# Patient Record
Sex: Female | Born: 1955 | Race: White | Hispanic: No | Marital: Married | Smoking: Never smoker
Health system: Southern US, Community
[De-identification: ages and names within clinical notes are randomized; demographics above are authoritative.]

## PROBLEM LIST (undated history)

## (undated) DIAGNOSIS — R112 Nausea with vomiting, unspecified: Secondary | ICD-10-CM

## (undated) DIAGNOSIS — T8859XA Other complications of anesthesia, initial encounter: Secondary | ICD-10-CM

## (undated) DIAGNOSIS — T4145XA Adverse effect of unspecified anesthetic, initial encounter: Secondary | ICD-10-CM

## (undated) DIAGNOSIS — E119 Type 2 diabetes mellitus without complications: Secondary | ICD-10-CM

## (undated) DIAGNOSIS — I213 ST elevation (STEMI) myocardial infarction of unspecified site: Secondary | ICD-10-CM

## (undated) DIAGNOSIS — E785 Hyperlipidemia, unspecified: Secondary | ICD-10-CM

## (undated) DIAGNOSIS — I2510 Atherosclerotic heart disease of native coronary artery without angina pectoris: Secondary | ICD-10-CM

## (undated) DIAGNOSIS — E042 Nontoxic multinodular goiter: Secondary | ICD-10-CM

## (undated) DIAGNOSIS — Z9889 Other specified postprocedural states: Secondary | ICD-10-CM

## (undated) DIAGNOSIS — E1169 Type 2 diabetes mellitus with other specified complication: Secondary | ICD-10-CM

## (undated) DIAGNOSIS — I10 Essential (primary) hypertension: Secondary | ICD-10-CM

## (undated) HISTORY — PX: HERNIA REPAIR: SHX51

## (undated) HISTORY — PX: CHOLECYSTECTOMY: SHX55

## (undated) HISTORY — PX: ABDOMINAL SURGERY: SHX537

## (undated) HISTORY — DX: Nontoxic multinodular goiter: E04.2

## (undated) HISTORY — PX: TONSILLECTOMY: SUR1361

---

## 1997-11-20 ENCOUNTER — Ambulatory Visit (HOSPITAL_COMMUNITY): Admission: RE | Admit: 1997-11-20 | Discharge: 1997-11-20 | Payer: Self-pay | Admitting: Obstetrics and Gynecology

## 2003-03-07 ENCOUNTER — Other Ambulatory Visit: Admission: RE | Admit: 2003-03-07 | Discharge: 2003-03-07 | Payer: Self-pay | Admitting: Obstetrics and Gynecology

## 2005-08-26 ENCOUNTER — Other Ambulatory Visit: Admission: RE | Admit: 2005-08-26 | Discharge: 2005-08-26 | Payer: Self-pay | Admitting: Obstetrics and Gynecology

## 2005-09-08 ENCOUNTER — Encounter: Admission: RE | Admit: 2005-09-08 | Discharge: 2005-09-08 | Payer: Self-pay | Admitting: Obstetrics and Gynecology

## 2006-08-27 ENCOUNTER — Other Ambulatory Visit: Admission: RE | Admit: 2006-08-27 | Discharge: 2006-08-27 | Payer: Self-pay | Admitting: Obstetrics and Gynecology

## 2006-08-27 ENCOUNTER — Encounter: Admission: RE | Admit: 2006-08-27 | Discharge: 2006-08-27 | Payer: Self-pay | Admitting: Obstetrics and Gynecology

## 2010-07-21 ENCOUNTER — Ambulatory Visit: Admit: 2010-07-21 | Payer: Self-pay | Admitting: Obstetrics and Gynecology

## 2015-09-05 ENCOUNTER — Other Ambulatory Visit (HOSPITAL_COMMUNITY): Payer: Self-pay | Admitting: Internal Medicine

## 2015-09-05 DIAGNOSIS — Z1231 Encounter for screening mammogram for malignant neoplasm of breast: Secondary | ICD-10-CM

## 2015-09-11 ENCOUNTER — Ambulatory Visit (HOSPITAL_COMMUNITY): Payer: Self-pay

## 2016-11-10 DIAGNOSIS — I213 ST elevation (STEMI) myocardial infarction of unspecified site: Secondary | ICD-10-CM

## 2016-11-10 HISTORY — DX: ST elevation (STEMI) myocardial infarction of unspecified site: I21.3

## 2016-11-28 ENCOUNTER — Encounter (HOSPITAL_COMMUNITY): Payer: Self-pay

## 2016-11-28 ENCOUNTER — Emergency Department (HOSPITAL_COMMUNITY): Payer: 59

## 2016-11-28 ENCOUNTER — Encounter (HOSPITAL_COMMUNITY)
Admission: EM | Disposition: A | Discharge status: Home / Self Care | Payer: Self-pay | Source: Home / Self Care | Attending: Cardiovascular Disease

## 2016-11-28 ENCOUNTER — Inpatient Hospital Stay (HOSPITAL_COMMUNITY)
Admission: EM | Admit: 2016-11-28 | Discharge: 2016-11-30 | DRG: 247 | Disposition: A | Discharge status: Home / Self Care | Payer: 59 | Attending: Cardiovascular Disease | Admitting: Cardiovascular Disease

## 2016-11-28 DIAGNOSIS — Z7982 Long term (current) use of aspirin: Secondary | ICD-10-CM | POA: Diagnosis not present

## 2016-11-28 DIAGNOSIS — Z79899 Other long term (current) drug therapy: Secondary | ICD-10-CM | POA: Diagnosis not present

## 2016-11-28 DIAGNOSIS — E1169 Type 2 diabetes mellitus with other specified complication: Secondary | ICD-10-CM

## 2016-11-28 DIAGNOSIS — E785 Hyperlipidemia, unspecified: Secondary | ICD-10-CM | POA: Diagnosis present

## 2016-11-28 DIAGNOSIS — I213 ST elevation (STEMI) myocardial infarction of unspecified site: Secondary | ICD-10-CM

## 2016-11-28 DIAGNOSIS — I10 Essential (primary) hypertension: Secondary | ICD-10-CM | POA: Diagnosis present

## 2016-11-28 DIAGNOSIS — E669 Obesity, unspecified: Secondary | ICD-10-CM | POA: Diagnosis present

## 2016-11-28 DIAGNOSIS — I2510 Atherosclerotic heart disease of native coronary artery without angina pectoris: Secondary | ICD-10-CM

## 2016-11-28 DIAGNOSIS — R079 Chest pain, unspecified: Secondary | ICD-10-CM | POA: Diagnosis not present

## 2016-11-28 DIAGNOSIS — I2121 ST elevation (STEMI) myocardial infarction involving left circumflex coronary artery: Secondary | ICD-10-CM | POA: Diagnosis present

## 2016-11-28 DIAGNOSIS — Z6841 Body Mass Index (BMI) 40.0 and over, adult: Secondary | ICD-10-CM | POA: Diagnosis not present

## 2016-11-28 DIAGNOSIS — Z8249 Family history of ischemic heart disease and other diseases of the circulatory system: Secondary | ICD-10-CM

## 2016-11-28 DIAGNOSIS — E041 Nontoxic single thyroid nodule: Secondary | ICD-10-CM | POA: Diagnosis present

## 2016-11-28 DIAGNOSIS — Z7984 Long term (current) use of oral hypoglycemic drugs: Secondary | ICD-10-CM

## 2016-11-28 DIAGNOSIS — Z955 Presence of coronary angioplasty implant and graft: Secondary | ICD-10-CM

## 2016-11-28 DIAGNOSIS — E119 Type 2 diabetes mellitus without complications: Secondary | ICD-10-CM | POA: Diagnosis present

## 2016-11-28 HISTORY — DX: Type 2 diabetes mellitus without complications: E11.9

## 2016-11-28 HISTORY — DX: Atherosclerotic heart disease of native coronary artery without angina pectoris: I25.10

## 2016-11-28 HISTORY — PX: CORONARY STENT INTERVENTION: CATH118234

## 2016-11-28 HISTORY — PX: LEFT HEART CATH AND CORONARY ANGIOGRAPHY: CATH118249

## 2016-11-28 HISTORY — DX: Essential (primary) hypertension: I10

## 2016-11-28 LAB — BASIC METABOLIC PANEL
Anion gap: 9 (ref 5–15)
BUN: 20 mg/dL (ref 6–20)
CHLORIDE: 102 mmol/L (ref 101–111)
CO2: 24 mmol/L (ref 22–32)
CREATININE: 1.23 mg/dL — AB (ref 0.44–1.00)
Calcium: 9.7 mg/dL (ref 8.9–10.3)
GFR calc Af Amer: 55 mL/min — ABNORMAL LOW (ref >60)
GFR calc non Af Amer: 47 mL/min — ABNORMAL LOW (ref >60)
GLUCOSE: 212 mg/dL — AB (ref 65–99)
POTASSIUM: 4.5 mmol/L (ref 3.5–5.1)
SODIUM: 135 mmol/L (ref 135–145)

## 2016-11-28 LAB — LIPID PANEL
CHOL/HDL RATIO: 4.3 ratio
Cholesterol: 205 mg/dL — ABNORMAL HIGH (ref 0–200)
HDL: 48 mg/dL (ref >40)
LDL CALC: 121 mg/dL — AB (ref 0–99)
TRIGLYCERIDES: 181 mg/dL — AB (ref <150)
VLDL: 36 mg/dL (ref 0–40)

## 2016-11-28 LAB — I-STAT TROPONIN, ED: Troponin i, poc: 0.51 ng/mL (ref 0.00–0.08)

## 2016-11-28 LAB — CBC WITH DIFFERENTIAL/PLATELET
Basophils Absolute: 0.1 10*3/uL (ref 0.0–0.1)
Basophils Relative: 1 %
EOS ABS: 0.3 10*3/uL (ref 0.0–0.7)
Eosinophils Relative: 4 %
HCT: 40.8 % (ref 36.0–46.0)
HEMOGLOBIN: 13.5 g/dL (ref 12.0–15.0)
LYMPHS ABS: 2.7 10*3/uL (ref 0.7–4.0)
LYMPHS PCT: 30 %
MCH: 29.2 pg (ref 26.0–34.0)
MCHC: 33.1 g/dL (ref 30.0–36.0)
MCV: 88.1 fL (ref 78.0–100.0)
MONOS PCT: 7 %
Monocytes Absolute: 0.6 10*3/uL (ref 0.1–1.0)
NEUTROS PCT: 58 %
Neutro Abs: 5.3 10*3/uL (ref 1.7–7.7)
Platelets: 311 10*3/uL (ref 150–400)
RBC: 4.63 MIL/uL (ref 3.87–5.11)
RDW: 13.4 % (ref 11.5–15.5)
WBC: 9 10*3/uL (ref 4.0–10.5)

## 2016-11-28 LAB — APTT: aPTT: 33 seconds (ref 24–36)

## 2016-11-28 LAB — HEPATIC FUNCTION PANEL
ALT: 30 U/L (ref 14–54)
AST: 38 U/L (ref 15–41)
Albumin: 4.1 g/dL (ref 3.5–5.0)
Alkaline Phosphatase: 110 U/L (ref 38–126)
BILIRUBIN DIRECT: 0.1 mg/dL (ref 0.1–0.5)
BILIRUBIN INDIRECT: 0.6 mg/dL (ref 0.3–0.9)
TOTAL PROTEIN: 7.6 g/dL (ref 6.5–8.1)
Total Bilirubin: 0.7 mg/dL (ref 0.3–1.2)

## 2016-11-28 LAB — CREATININE, SERUM
Creatinine, Ser: 1.07 mg/dL — ABNORMAL HIGH (ref 0.44–1.00)
GFR calc Af Amer: >60 mL/min (ref >60)
GFR calc non Af Amer: 56 mL/min — ABNORMAL LOW (ref >60)

## 2016-11-28 LAB — BRAIN NATRIURETIC PEPTIDE: B Natriuretic Peptide: 69.6 pg/mL (ref 0.0–100.0)

## 2016-11-28 LAB — CBC
HCT: 39.5 % (ref 36.0–46.0)
Hemoglobin: 12.7 g/dL (ref 12.0–15.0)
MCH: 28.5 pg (ref 26.0–34.0)
MCHC: 32.2 g/dL (ref 30.0–36.0)
MCV: 88.8 fL (ref 78.0–100.0)
PLATELETS: 307 10*3/uL (ref 150–400)
RBC: 4.45 MIL/uL (ref 3.87–5.11)
RDW: 13.6 % (ref 11.5–15.5)
WBC: 10.5 10*3/uL (ref 4.0–10.5)

## 2016-11-28 LAB — MRSA PCR SCREENING: MRSA BY PCR: NEGATIVE

## 2016-11-28 LAB — GLUCOSE, CAPILLARY: GLUCOSE-CAPILLARY: 154 mg/dL — AB (ref 65–99)

## 2016-11-28 LAB — PROTIME-INR
INR: 0.96
Prothrombin Time: 12.8 seconds (ref 11.4–15.2)

## 2016-11-28 LAB — TROPONIN I: TROPONIN I: 2.05 ng/mL — AB (ref <0.03)

## 2016-11-28 SURGERY — LEFT HEART CATH AND CORONARY ANGIOGRAPHY
Anesthesia: LOCAL

## 2016-11-28 MED ORDER — FENTANYL CITRATE (PF) 100 MCG/2ML IJ SOLN
INTRAMUSCULAR | Status: DC | PRN
Start: 2016-11-28 — End: 2016-11-28
  Administered 2016-11-28 (×2): 25 ug via INTRAVENOUS

## 2016-11-28 MED ORDER — VERAPAMIL HCL 2.5 MG/ML IV SOLN
INTRAVENOUS | Status: AC
  Filled 2016-11-28: qty 2

## 2016-11-28 MED ORDER — TICAGRELOR 90 MG PO TABS
90.0000 mg | ORAL_TABLET | Freq: Two times a day (BID) | ORAL | Status: DC
  Administered 2016-11-29 – 2016-11-30 (×3): 90 mg via ORAL
  Filled 2016-11-28 (×3): qty 1

## 2016-11-28 MED ORDER — HEPARIN (PORCINE) IN NACL 2-0.9 UNIT/ML-% IJ SOLN
INTRAMUSCULAR | Status: DC | PRN
Start: 2016-11-28 — End: 2016-11-28
  Administered 2016-11-28: 16:00:00

## 2016-11-28 MED ORDER — SODIUM CHLORIDE 0.9 % IV SOLN: INTRAVENOUS | Status: AC

## 2016-11-28 MED ORDER — OLMESARTAN MEDOXOMIL-HCTZ 40-12.5 MG PO TABS: 1.0000 | ORAL_TABLET | Freq: Every day | ORAL | Status: DC

## 2016-11-28 MED ORDER — TIROFIBAN (AGGRASTAT) BOLUS VIA INFUSION
INTRAVENOUS | Status: DC | PRN
  Administered 2016-11-28: 3005 ug via INTRAVENOUS

## 2016-11-28 MED ORDER — METOPROLOL TARTRATE 25 MG PO TABS
25.0000 mg | ORAL_TABLET | Freq: Two times a day (BID) | ORAL | Status: DC
  Administered 2016-11-29 – 2016-11-30 (×2): 25 mg via ORAL
  Filled 2016-11-28 (×3): qty 1

## 2016-11-28 MED ORDER — TIROFIBAN HCL IN NACL 5-0.9 MG/100ML-% IV SOLN: INTRAVENOUS | Status: DC | PRN

## 2016-11-28 MED ORDER — SODIUM CHLORIDE 0.9% FLUSH: 3.0000 mL | INTRAVENOUS | Status: DC | PRN

## 2016-11-28 MED ORDER — NITROGLYCERIN 1 MG/10 ML FOR IR/CATH LAB
INTRA_ARTERIAL | Status: DC | PRN
  Administered 2016-11-28 (×2): 200 ug via INTRACORONARY

## 2016-11-28 MED ORDER — IOPAMIDOL (ISOVUE-370) INJECTION 76%
INTRAVENOUS | Status: DC | PRN
  Administered 2016-11-28: 160 mL

## 2016-11-28 MED ORDER — HEPARIN SODIUM (PORCINE) 1000 UNIT/ML IJ SOLN
INTRAMUSCULAR | Status: AC
  Filled 2016-11-28: qty 1

## 2016-11-28 MED ORDER — NITROGLYCERIN 1 MG/10 ML FOR IR/CATH LAB
INTRA_ARTERIAL | Status: AC
  Filled 2016-11-28: qty 10

## 2016-11-28 MED ORDER — AMLODIPINE BESYLATE 5 MG PO TABS
5.0000 mg | ORAL_TABLET | Freq: Every day | ORAL | Status: DC
  Administered 2016-11-28 – 2016-11-29 (×2): 5 mg via ORAL
  Filled 2016-11-28 (×3): qty 1

## 2016-11-28 MED ORDER — ATORVASTATIN CALCIUM 80 MG PO TABS
80.0000 mg | ORAL_TABLET | Freq: Every day | ORAL | Status: DC
Start: 2016-11-28 — End: 2016-11-30
  Administered 2016-11-28: 80 mg via ORAL
  Filled 2016-11-28 (×2): qty 1

## 2016-11-28 MED ORDER — HEPARIN (PORCINE) IN NACL 100-0.45 UNIT/ML-% IJ SOLN
1050.0000 [IU]/h | INTRAMUSCULAR | Status: DC
  Administered 2016-11-28: 1050 [IU]/h via INTRAVENOUS
  Filled 2016-11-28: qty 250

## 2016-11-28 MED ORDER — INSULIN ASPART 100 UNIT/ML ~~LOC~~ SOLN
0.0000 [IU] | Freq: Three times a day (TID) | SUBCUTANEOUS | Status: DC
  Administered 2016-11-28: 2 [IU] via SUBCUTANEOUS
  Administered 2016-11-29: 3 [IU] via SUBCUTANEOUS
  Administered 2016-11-29: 5 [IU] via SUBCUTANEOUS
  Administered 2016-11-29: 3 [IU] via SUBCUTANEOUS
  Administered 2016-11-30: 2 [IU] via SUBCUTANEOUS

## 2016-11-28 MED ORDER — NITROGLYCERIN 0.4 MG SL SUBL
0.4000 mg | SUBLINGUAL_TABLET | SUBLINGUAL | Status: DC | PRN
Start: 2016-11-28 — End: 2016-11-30

## 2016-11-28 MED ORDER — SODIUM CHLORIDE 0.9 % IV BOLUS (SEPSIS)
500.0000 mL | Freq: Once | INTRAVENOUS | Status: AC
  Administered 2016-11-28: 500 mL via INTRAVENOUS

## 2016-11-28 MED ORDER — LIDOCAINE HCL (PF) 1 % IJ SOLN
INTRAMUSCULAR | Status: DC | PRN
Start: 2016-11-28 — End: 2016-11-28
  Administered 2016-11-28: 2 mL

## 2016-11-28 MED ORDER — FENTANYL CITRATE (PF) 100 MCG/2ML IJ SOLN
INTRAMUSCULAR | Status: AC
  Filled 2016-11-28: qty 2

## 2016-11-28 MED ORDER — LABETALOL HCL 5 MG/ML IV SOLN: 10.0000 mg | INTRAVENOUS | Status: AC | PRN

## 2016-11-28 MED ORDER — ONDANSETRON HCL 4 MG/2ML IJ SOLN: 4.0000 mg | Freq: Four times a day (QID) | INTRAMUSCULAR | Status: DC | PRN

## 2016-11-28 MED ORDER — GLIPIZIDE 10 MG PO TABS
10.0000 mg | ORAL_TABLET | Freq: Two times a day (BID) | ORAL | Status: DC
  Administered 2016-11-28 – 2016-11-30 (×4): 10 mg via ORAL
  Filled 2016-11-28 (×5): qty 1

## 2016-11-28 MED ORDER — ENOXAPARIN SODIUM 40 MG/0.4ML ~~LOC~~ SOLN
40.0000 mg | SUBCUTANEOUS | Status: DC
  Administered 2016-11-30: 40 mg via SUBCUTANEOUS
  Filled 2016-11-28 (×2): qty 0.4

## 2016-11-28 MED ORDER — ASPIRIN 81 MG PO CHEW
324.0000 mg | CHEWABLE_TABLET | Freq: Once | ORAL | Status: AC
  Administered 2016-11-28: 324 mg via ORAL
  Filled 2016-11-28: qty 4

## 2016-11-28 MED ORDER — MIDAZOLAM HCL 2 MG/2ML IJ SOLN
INTRAMUSCULAR | Status: AC
  Filled 2016-11-28: qty 2

## 2016-11-28 MED ORDER — HEPARIN SODIUM (PORCINE) 1000 UNIT/ML IJ SOLN
INTRAMUSCULAR | Status: DC | PRN
  Administered 2016-11-28: 8000 [IU] via INTRAVENOUS

## 2016-11-28 MED ORDER — IOPAMIDOL (ISOVUE-370) INJECTION 76%
INTRAVENOUS | Status: AC
Start: 2016-11-28 — End: 2016-11-28
  Filled 2016-11-28: qty 50

## 2016-11-28 MED ORDER — ACETAMINOPHEN 325 MG PO TABS: 650.0000 mg | ORAL_TABLET | ORAL | Status: DC | PRN

## 2016-11-28 MED ORDER — ASPIRIN 81 MG PO CHEW
81.0000 mg | CHEWABLE_TABLET | Freq: Every day | ORAL | Status: DC
  Administered 2016-11-29 – 2016-11-30 (×2): 81 mg via ORAL
  Filled 2016-11-28 (×2): qty 1

## 2016-11-28 MED ORDER — IRBESARTAN 300 MG PO TABS
300.0000 mg | ORAL_TABLET | Freq: Every day | ORAL | Status: DC
  Administered 2016-11-29 – 2016-11-30 (×2): 300 mg via ORAL
  Filled 2016-11-28 (×2): qty 1

## 2016-11-28 MED ORDER — MIDAZOLAM HCL 2 MG/2ML IJ SOLN
INTRAMUSCULAR | Status: DC | PRN
Start: 2016-11-28 — End: 2016-11-28
  Administered 2016-11-28 (×2): 2 mg via INTRAVENOUS

## 2016-11-28 MED ORDER — HYDROCHLOROTHIAZIDE 12.5 MG PO CAPS
12.5000 mg | ORAL_CAPSULE | Freq: Every day | ORAL | Status: DC
  Administered 2016-11-29 – 2016-11-30 (×2): 12.5 mg via ORAL
  Filled 2016-11-28 (×2): qty 1

## 2016-11-28 MED ORDER — NITROGLYCERIN IN D5W 200-5 MCG/ML-% IV SOLN
5.0000 ug/min | Freq: Once | INTRAVENOUS | Status: AC
  Administered 2016-11-28: 5 ug/min via INTRAVENOUS
  Filled 2016-11-28: qty 250

## 2016-11-28 MED ORDER — HEPARIN (PORCINE) IN NACL 2-0.9 UNIT/ML-% IJ SOLN
INTRAMUSCULAR | Status: AC
  Filled 2016-11-28: qty 1000

## 2016-11-28 MED ORDER — IOPAMIDOL (ISOVUE-370) INJECTION 76%
INTRAVENOUS | Status: AC
  Filled 2016-11-28: qty 125

## 2016-11-28 MED ORDER — SODIUM CHLORIDE 0.9 % IV SOLN: 250.0000 mL | INTRAVENOUS | Status: DC | PRN

## 2016-11-28 MED ORDER — TICAGRELOR 90 MG PO TABS
ORAL_TABLET | ORAL | Status: DC | PRN
  Administered 2016-11-28: 180 mg via ORAL

## 2016-11-28 MED ORDER — SODIUM CHLORIDE 0.9 % IV SOLN
INTRAVENOUS | Status: DC
  Administered 2016-11-28: 13:00:00 via INTRAVENOUS

## 2016-11-28 MED ORDER — HEPARIN BOLUS VIA INFUSION
4000.0000 [IU] | Freq: Once | INTRAVENOUS | Status: AC
  Administered 2016-11-28: 4000 [IU] via INTRAVENOUS

## 2016-11-28 MED ORDER — LIDOCAINE HCL (PF) 1 % IJ SOLN
INTRAMUSCULAR | Status: AC
  Filled 2016-11-28: qty 30

## 2016-11-28 MED ORDER — IOPAMIDOL (ISOVUE-370) INJECTION 76%
100.0000 mL | Freq: Once | INTRAVENOUS | Status: AC | PRN
  Administered 2016-11-28: 100 mL via INTRAVENOUS

## 2016-11-28 MED ORDER — VERAPAMIL HCL 2.5 MG/ML IV SOLN
INTRAVENOUS | Status: DC | PRN
  Administered 2016-11-28: 10 mL via INTRA_ARTERIAL

## 2016-11-28 MED ORDER — HYDRALAZINE HCL 20 MG/ML IJ SOLN
5.0000 mg | INTRAMUSCULAR | Status: AC | PRN
  Administered 2016-11-28: 5 mg via INTRAVENOUS
  Filled 2016-11-28: qty 1

## 2016-11-28 MED ORDER — SODIUM CHLORIDE 0.9% FLUSH
3.0000 mL | Freq: Two times a day (BID) | INTRAVENOUS | Status: DC
  Administered 2016-11-29 – 2016-11-30 (×2): 3 mL via INTRAVENOUS

## 2016-11-28 SURGICAL SUPPLY — 20 items
BALLN EMERGE MR 2.5X12 (BALLOONS) ×2
BALLN ~~LOC~~ MOZEC 3.0X13 (BALLOONS) ×2
BALLOON EMERGE MR 2.5X12 (BALLOONS) ×1 IMPLANT
BALLOON ~~LOC~~ MOZEC 3.0X13 (BALLOONS) ×1 IMPLANT
CATH 5FR JL3.5 JR4 ANG PIG MP (CATHETERS) ×2 IMPLANT
CATH VISTA GUIDE 6FR XBLAD3.0 (CATHETERS) ×2 IMPLANT
DEVICE RAD COMP TR BAND LRG (VASCULAR PRODUCTS) ×2 IMPLANT
ELECT DEFIB PAD ADLT CADENCE (PAD) ×2 IMPLANT
GLIDESHEATH SLEND SS 6F .021 (SHEATH) ×2 IMPLANT
GUIDEWIRE INQWIRE 1.5J.035X260 (WIRE) ×1 IMPLANT
HOVERMATT SINGLE USE (MISCELLANEOUS) ×2 IMPLANT
INQWIRE 1.5J .035X260CM (WIRE) ×2
KIT ENCORE 26 ADVANTAGE (KITS) ×2 IMPLANT
KIT HEART LEFT (KITS) ×2 IMPLANT
PACK CARDIAC CATHETERIZATION (CUSTOM PROCEDURE TRAY) ×2 IMPLANT
STENT PROMUS PREM MR 3.0X16 (Permanent Stent) ×2 IMPLANT
SYR MEDRAD MARK V 150ML (SYRINGE) ×2 IMPLANT
TRANSDUCER W/STOPCOCK (MISCELLANEOUS) ×2 IMPLANT
TUBING CIL FLEX 10 FLL-RA (TUBING) ×2 IMPLANT
WIRE COUGAR XT STRL 190CM (WIRE) ×2 IMPLANT

## 2016-11-28 NOTE — Progress Notes (Signed)
ANTICOAGULATION CONSULT NOTE - Initial Consult  Pharmacy Consult for HEPARIN Indication: chest pain/ACS  Allergies  Allergen Reactions  . Shellfish Allergy    Patient Measurements: Height: 5\' 6"  (167.6 cm) Weight: 265 lb (120.2 kg) IBW/kg (Calculated) : 59.3 HEPARIN DW (KG): 87.9  Vital Signs: Temp: 98.9 F (37.2 C) (05/19 1242) Temp Source: Oral (05/19 1242) BP: 159/105 (05/19 1300) Pulse Rate: 101 (05/19 1300)  Labs:  Recent Labs  11/28/16 1242  HGB 13.5  HCT 40.8  PLT 311  CREATININE 1.23*   Estimated Creatinine Clearance: 65.1 mL/min (A) (by C-G formula based on SCr of 1.23 mg/dL (H)).  Medical History: Past Medical History:  Diagnosis Date  . Diabetes mellitus without complication (HCC)    Medications:   (Not in a hospital admission)  Assessment: 60yo obese female c/o CP.  Asked to initiate Heparin for ACS.  Goal of Therapy:  Heparin level 0.3-0.7 units/ml Monitor platelets by anticoagulation protocol: Yes   Plan:   Heparin 4000 units IV now x 1  Heparin infusion at 1050 units/hr  Heparin level in 6-8 hrs then daily  CBC daily while on Heparin  Margo AyeHall, Hazeline Charnley A 11/28/2016,1:47 PM

## 2016-11-28 NOTE — ED Provider Notes (Signed)
[facility]EM[facility]Y DEPT Provider Note   CSN: [id number] Arrival date & time: 11/28/16  1233   By signing my name below, I, Bobbie Stack, attest that this documentation has been prepared under the direction and in the presence of Felicia Core, MD. Electronically Signed: Bobbie Stack, Scribe. 11/28/16. 1:11 PM. History   Chief Complaint Chief Complaint  Patient presents with  . Chest Pain    The history is provided by the patient. No language interpreter was used.  HPI Comments: Felicia Silva is a 61 y.o. female who presents to the Emergency Department complaining of intermittent chest pain since yesterday. She describes the pain as a "pressure". She states that the pain radiates to both arms, left jaw, and between shoulder blads. Nothing makes her pain better or worse. She states that her pain is much improved currently. She was seen yesterday by her PCP and was noted to have ST elevation on her EKG and her troponins were elevated at that time. She states that she has been stressed recently and is not sure if this is related to her pain. She states that a similar pain occurred last week and she became diaphoretic at that time. She is not diaphoretic today. She denies fevers recently.  Past Medical History:  Diagnosis Date  . Diabetes mellitus without complication (HCC)     There are no active problems to display for this patient.   Past Surgical History:  Procedure Laterality Date  . ABDOMINAL SURGERY    . CHOLECYSTECTOMY    . HERNIA REPAIR    . TONSILLECTOMY      OB History    No data available       Home Medications    Prior to Admission medications   Medication Sig Start Date End Date Taking? Authorizing Provider  amLODipine (NORVASC) 5 MG tablet Take 5 mg by mouth at bedtime. 11/27/16  Yes [provider]  aspirin 81 MG chewable tablet Chew 81 mg by mouth daily.   Yes [provider]  BENICAR HCT 40-12.5 MG tablet Take 1 tablet by mouth  daily. 11/18/16  Yes [provider]  glipiZIDE (GLUCOTROL) 10 MG tablet Take 1 tablet by mouth 2 (two) times daily. 11/17/16  Yes [provider]  metFORMIN (GLUCOPHAGE) 500 MG tablet Take 1,000 mg by mouth 2 (two) times daily.  11/17/16  Yes [provider]    Family History No family history on file.  Social History Social History  Substance Use Topics  . Smoking status: Never Smoker  . Smokeless tobacco: Never Used  . Alcohol use No     Allergies   Shellfish allergy   Review of Systems Review of Systems  Constitutional: Negative for fever.  Cardiovascular: Positive for chest pain.  Musculoskeletal: Positive for arthralgias and myalgias.       Bilateral arm pain.  Skin: Negative for rash.  Neurological: Negative for headaches.  All other systems reviewed and are negative.    Physical Exam Updated Vital Signs BP (!) 152/113   Pulse 94   Temp 98.9 F (37.2 C) (Oral)   Resp 14   Ht 5\' 6"  (1.676 m)   Wt 265 lb (120.2 kg)   SpO2 94%   BMI 42.77 kg/m   Physical Exam  Constitutional: She is oriented to person, place, and time. She appears well-developed and well-nourished.  HENT:  Head: Normocephalic.  Eyes: EOM are normal.  Neck: Normal range of motion.  Cardiovascular: Normal rate and regular rhythm.   No  murmur heard. No murmurs.  Pulmonary/Chest: Effort normal. She exhibits no tenderness.  No tenderness on chest wall.  Abdominal: She exhibits no distension.  Musculoskeletal: Normal range of motion.  Neurological: She is alert and oriented to person, place, and time.  Psychiatric: She has a normal mood and affect.  Nursing note and vitals reviewed.   ED Treatments / Results  DIAGNOSTIC STUDIES: Oxygen Saturation is 97% on RA, normal by my interpretation.    COORDINATION OF CARE: 1:04 PM Discussed treatment plan with pt at bedside and pt agreed to plan. I will do a full cardiac work-up for the patient.  Labs (all labs ordered  are listed, but only abnormal results are displayed) Labs Reviewed  BASIC METABOLIC PANEL - Abnormal; Notable for the following:       Result Value   Glucose, Bld 212 (*)    Creatinine, Ser 1.23 (*)    GFR calc non Af Amer 47 (*)    GFR calc Af Amer 55 (*)    All other components within normal limits  LIPID PANEL - Abnormal; Notable for the following:    Cholesterol 205 (*)    Triglycerides 181 (*)    LDL Cholesterol 121 (*)    All other components within normal limits  I-STAT TROPOININ, ED - Abnormal; Notable for the following:    Troponin i, poc 0.51 (*)    All other components within normal limits  CBC WITH DIFFERENTIAL/PLATELET  PROTIME-INR  APTT  HEPATIC FUNCTION PANEL  HEPARIN LEVEL (UNFRACTIONATED)    EKG  EKG Interpretation  Date/Time:  Saturday Nov 28 2016 15:02:29 EDT Ventricular Rate:  93 PR Interval:    QRS Duration: 92 QT Interval:  362 QTC Calculation: 451 R Axis:   37 Text Interpretation:  Sinus rhythm Anterior infarct, old Confirmed by Rubin Payor  MD, Rilley Stash [phone]) on 11/28/2016 3:05:38 PM       Radiology Dg Chest 2 View  Result Date: 11/28/2016 CLINICAL DATA:  Chest pressure EXAM: CHEST  2 VIEW COMPARISON:  None. FINDINGS: Low lung volumes. Heart and mediastinal contours are within normal limits. No focal opacities or effusions. No acute bony abnormality. IMPRESSION: Low volumes.  No active cardiopulmonary disease. Electronically Signed   By: Charlett Nose M.D.   On: 11/28/2016 13:40   Ct Angio Chest Aorta W And/or Wo Contrast  Result Date: 11/28/2016 CLINICAL DATA:  Anterior chest pain radiating to the back for 1 day EXAM: CT ANGIOGRAPHY CHEST WITH CONTRAST TECHNIQUE: Multidetector CT imaging of the chest was performed using the standard protocol during bolus administration of intravenous contrast. Multiplanar CT image reconstructions and MIPs were obtained to evaluate the vascular anatomy. CONTRAST:  100 cc Isovue 370 COMPARISON:  None. FINDINGS:  Cardiovascular: There are no filling defects in the pulmonary arterial tree to suggest acute pulmonary thromboembolism. No evidence of aortic dissection or aneurysm. Distal LAD territory coronary artery calcifications are present. Mediastinum/Nodes: No abnormal mediastinal adenopathy. Possible 1.2 cm right thyroid nodule. Lungs/Pleura: No pneumothorax or pleural effusion. Clear lungs. Upper Abdomen: Diffuse hepatic steatosis.  Postcholecystectomy. Musculoskeletal: No vertebral compression deformity. Anterior bridging osteophytes in the mid and lower thoracic spine. Posterior osteophytic ridging at C5-6 and C6-7 in the lower cervical spine. Review of the MIP images confirms the above findings. IMPRESSION: No evidence of acute pulmonary thromboembolism Possible right thyroid nodule.  Thyroid ultrasound is recommended. Electronically Signed   By: Jolaine Click M.D.   On: 11/28/2016 14:26    Procedures Procedures (including critical care time)  Medications Ordered in ED Medications  0.9 %  sodium chloride infusion ( Intravenous New Bag/Given 11/28/16 1323)  heparin ADULT infusion 100 units/mL (25000 units/25250mL sodium chloride 0.45%) (1,050 Units/hr Intravenous New Bag/Given 11/28/16 1417)  aspirin chewable tablet 324 mg (324 mg Oral Given 11/28/16 1322)  heparin bolus via infusion 4,000 Units (4,000 Units Intravenous Bolus from Bag 11/28/16 1417)  iopamidol (ISOVUE-370) 76 % injection 100 mL (100 mLs Intravenous Contrast Given 11/28/16 1356)  nitroGLYCERIN 50 mg in dextrose 5 % 250 mL (0.2 mg/mL) infusion (5 mcg/min Intravenous New Bag/Given 11/28/16 1507)     Initial Impression / Assessment and Plan / ED Course  I have reviewed the triage vital signs and the nursing notes.  Pertinent labs & imaging results that were available during my care of the patient were reviewed by me and considered in my medical decision making (see chart for details).     Patient presents with chest pain. Somewhat worrisome  story for cardiac cause the pressure. Has had a couple episodes of her last couple days. History of diabetes. High blood pressure and high cholesterol. Initial EKG showed a fair amount of baseline wander. Repeat EKG showed some ST elevation inferiorly but believe under 1 mm with no reciprocal changes. Initial troponin is elevated but also was reportedly elevated yesterday at an unknown level. Pain has improved upon arrival to the ER here. With the pain radiating to the back CT angiography done which was negative for PE or dissection. Discussed with Dr. Anne FuSkains from cardiology. Requested repeat EKG. Repeat EKG showed similar symptoms with mild ST elevation inferiorly. May have some PR depression inferiorly. He discussed with Dr. Excell Seltzerooper the interventionist on-call and decision was made to call a code STEMI. Initially not called due to no worse changes and ST elevation of under 1 mm. Possibly just 1 mm. Patient did have slight return of pain on the ER and nitroglycerin started. Heparin and aspirin are to been given. [facility]  CRITICAL CARE Performed by: Billee CashingPICKERING,Haiden Clucas R. Total critical care time: 30 minutes Critical care time was exclusive of separately billable procedures and treating other patients. Critical care was necessary to treat or prevent imminent or life-threatening deterioration. Critical care was time spent personally by me on the following activities: development of treatment plan with patient and/or surrogate as well as nursing, discussions with consultants, evaluation of patient's response to treatment, examination of patient, obtaining history from patient or surrogate, ordering and performing treatments and interventions, ordering and review of laboratory studies, ordering and review of radiographic studies, pulse oximetry and re-evaluation of patient's condition.   Final Clinical Impressions(s) / ED Diagnoses   Final diagnoses:  ST elevation myocardial infarction  (STEMI), unspecified artery (HCC)    New Prescriptions New Prescriptions   No medications on file   I personally performed the services described in this documentation, which was scribed in my presence. The recorded information has been reviewed and is accurate.      Felicia CorePickering, Saba Neuman, MD 11/28/16 1517

## 2016-11-28 NOTE — H&P (Signed)
History and Physical  Patient ID: Neomia GlassBrenda K [place] MRN: [id number], SOB: Feb 18, 1956 61 y.o. Date of Encounter: 11/28/2016, 5:13 PM  Primary Physician: Benita StabileHall, John Z, MD Primary Cardiologist: Sol PasserNew (Tiffanny Lamarche)  Chief Complaint: Chest pain  HPI: 61 y.o. female w/ PMHx significant for HTN, Type II DM, obesity, who [facility]/28/2016 with complaints of chest pain.  The patient presents with intermittent chest discomfort occurring over the last 3-4 days. She has a history of sharp chest pains, but yesterday she developed pressure-like pain in the center of her chest. She was seen by her primary care physician's office. Initially it was recommended that she go to the emergency room, but she was evaluated as an outpatient in the office and she had a pre-existing appointment. She had chest pain on and off overnight and it became more severe this morning. She ultimately went to the emergency department [facility] for [facility]. Her EKG was borderline with 1 mm of inferior ST elevation. Her cardiac markers were abnormal. Considering the entire clinical picture, we elected to call a code STEMI and bring [facility] directly [facility] to the cardiac catheterization lab.  On arrival here, the patient continues to have mild chest discomfort described as a pressure-like sensation in the center of her chest into the left arm. She also complains of headache. She's on a nitroglycerin drip because of marked hypertension. She denies nausea, vomiting, or shortness of breath. She had diaphoresis yesterday but none today. No past history of similar symptoms. She had a CAT scan of the chest this morning demonstrating no evidence for aortic dissection or pulmonary embolism.   Past Medical History:  Diagnosis Date  . Diabetes mellitus without complication (HCC)   . Hypertension      Surgical History:  Past Surgical History:  Procedure Laterality Date  . ABDOMINAL SURGERY     . CHOLECYSTECTOMY    . HERNIA REPAIR    . TONSILLECTOMY       Home Meds: Prior to Admission medications   Medication Sig Start Date End Date Taking? Authorizing Provider  amLODipine (NORVASC) 5 MG tablet Take 5 mg by mouth at bedtime. 11/27/16  Yes [provider]  aspirin 81 MG chewable tablet Chew 81 mg by mouth daily.   Yes [provider]  BENICAR HCT 40-12.5 MG tablet Take 1 tablet by mouth daily. 11/18/16  Yes [provider]  glipiZIDE (GLUCOTROL) 10 MG tablet Take 1 tablet by mouth 2 (two) times daily. 11/17/16  Yes [provider]  metFORMIN (GLUCOPHAGE) 500 MG tablet Take 1,000 mg by mouth 2 (two) times daily.  11/17/16  Yes [provider]    Allergies:  Allergies  Allergen Reactions  . Shellfish Allergy Itching and Swelling    Social History   Social History  . Marital status: Single    Spouse name: N/A  . Number of children: N/A  . Years of education: N/A   Occupational History  . Not on file.   Social History Main Topics  . Smoking status: Never Smoker  . Smokeless tobacco: Never Used  . Alcohol use No  . Drug use: No  . Sexual activity: Not on file   Other Topics Concern  . Not on file   Social History Narrative   nonsmoker     Family History  Problem Relation Age of Onset  . Heart attack Father [id number]    Review of Systems: General: negative for  chills, fever, night sweats or weight changes.  ENT: negative for rhinorrhea or epistaxis Cardiovascular: see HPI Dermatological: negative for rash Respiratory: negative for cough or wheezing GI: negative for nausea, vomiting, diarrhea, bright red blood per rectum, melena, or hematemesis GU: no hematuria, urgency, or frequency Neurologic: negative for visual changes, syncope, headache, or dizziness Heme: no easy bruising or bleeding Endo: negative for excessive thirst, thyroid disorder, or flushing Musculoskeletal: positive for back pain All other systems  reviewed and are otherwise negative except as noted above.  Physical Exam: Blood pressure (!) 144/105, pulse 95, temperature 98.9 F (37.2 C), temperature source Oral, resp. rate 15, height 5\' 6"  (1.676 m), weight 265 lb (120.2 kg), SpO2 97 %. General: Well developed, well nourished, pleasant obese woman, alert and oriented, in mild distress. HEENT: Normocephalic, atraumatic, sclera anicteric Neck: Supple. Carotids 2+ without bruits. JVP normal Lungs: Clear bilaterally to auscultation without wheezes, rales, or rhonchi. Breathing is unlabored. Heart: RRR with normal S1 and S2. No murmurs, rubs, or gallops appreciated. Abdomen: Soft, non-tender, non-distended with normoactive bowel sounds. No hepatomegaly. No rebound/guarding. No obvious abdominal masses. obese Back: No CVA tenderness Msk:  Strength and tone appear normal for age. Extremities: No clubbing, cyanosis, or edema.  Distal pedal pulses are 2+ and equal bilaterally. Neuro: CNII-XII intact, moves all extremities spontaneously. Psych:  Responds to questions appropriately with a normal affect. Skin: warm and dry without rash   Labs:   Lab Results  Component Value Date   WBC 9.0 11/28/2016   HGB 13.5 11/28/2016   HCT 40.8 11/28/2016   MCV 88.1 11/28/2016   PLT 311 11/28/2016     Recent Labs Lab 11/28/16 1242  NA 135  K 4.5  CL 102  CO2 24  BUN 20  CREATININE 1.23*  CALCIUM 9.7  PROT 7.6  BILITOT 0.7  ALKPHOS 110  ALT 30  AST 38  GLUCOSE 212*   No results for input(s): CKTOTAL, CKMB, TROPONINI in the last 72 hours. Lab Results  Component Value Date   CHOL 205 (H) 11/28/2016   HDL 48 11/28/2016   LDLCALC 121 (H) 11/28/2016   TRIG 181 (H) 11/28/2016   No results found for: DDIMER  Radiology/Studies:  Dg Chest 2 View  Result Date: 11/28/2016 CLINICAL DATA:  Chest pressure EXAM: CHEST  2 VIEW COMPARISON:  None. FINDINGS: Low lung volumes. Heart and mediastinal contours are within normal limits. No focal  opacities or effusions. No acute bony abnormality. IMPRESSION: Low volumes.  No active cardiopulmonary disease. Electronically Signed   By: Charlett Nose M.D.   On: 11/28/2016 13:40   Ct Angio Chest Aorta W And/or Wo Contrast  Result Date: 11/28/2016 CLINICAL DATA:  Anterior chest pain radiating to the back for 1 day EXAM: CT ANGIOGRAPHY CHEST WITH CONTRAST TECHNIQUE: Multidetector CT imaging of the chest was performed using the standard protocol during bolus administration of intravenous contrast. Multiplanar CT image reconstructions and MIPs were obtained to evaluate the vascular anatomy. CONTRAST:  100 cc Isovue 370 COMPARISON:  None. FINDINGS: Cardiovascular: There are no filling defects in the pulmonary arterial tree to suggest acute pulmonary thromboembolism. No evidence of aortic dissection or aneurysm. Distal LAD territory coronary artery calcifications are present. Mediastinum/Nodes: No abnormal mediastinal adenopathy. Possible 1.2 cm right thyroid nodule. Lungs/Pleura: No pneumothorax or pleural effusion. Clear lungs. Upper Abdomen: Diffuse hepatic steatosis.  Postcholecystectomy. Musculoskeletal: No vertebral compression deformity. Anterior bridging osteophytes in the mid and lower thoracic spine. Posterior osteophytic ridging at C5-6 and C6-7 in  the lower cervical spine. Review of the MIP images confirms the above findings. IMPRESSION: No evidence of acute pulmonary thromboembolism Possible right thyroid nodule.  Thyroid ultrasound is recommended. Electronically Signed   By: Jolaine Click M.D.   On: 11/28/2016 14:26     EKG: NSR with borderline ST elevation inferiorly suspicious for STEMI  CARDIAC STUDIES: pending  ASSESSMENT AND PLAN:  1. Inferior STEMI: EKG changes are borderline, but clinical picture is consistent with acute coronary syndrome with ongoing chest pain, elevated troponin, and EKG changes as outlined. Plan emergency cardiac catheterization and possible PCI. Emergency implied  consent is obtained. The patient has received heparin, nitroglycerin, and aspirin. Further plans pending the results of her heart catheterization.  2. Type 2 diabetes: Hold metformin because of ionated contrast administration. Cover with sliding scale insulin. Work on lifestyle modification and counseling.  3. Uncontrolled hypertension: Will modify antihypertensive regimen during her hospitalization and treat her with a beta blocker and ACE inhibitor.  4. Obesity with BMI greater than 40. Counseling will be done.  Disposition: Pending cardiac catheterization/PCI result  Signed, Tonny Bollman MD 11/28/2016, 5:13 PM

## 2016-11-28 NOTE — ED Notes (Signed)
[facility]. No answer in cath lab.

## 2016-11-28 NOTE — ED Triage Notes (Signed)
Pt reports chest pressure since yesterday morning.  Dr. Scharlene GlossHall's office called and reports pt had some ST elevation yesterday on her EKG yesterday and today her troponin came back elevated.  Pt reports pressure is in chest, between shoulder blades, and both arms aching and left jaw.

## 2016-11-28 NOTE — ED Notes (Signed)
Pt in radiology at this time. 

## 2016-11-28 NOTE — ED Notes (Signed)
MD Pickering notified of I-stat troponin level of 0.51. MD to bedside.

## 2016-11-28 NOTE — Plan of Care (Signed)
Called by the bedside RN to notify me that Ms. Felicia Silva had an episode of bradycardia and hypotension, with HR in the low 60s and SBP<6770mmHg for a brief period. I asked that she give a 500mL NS bolus and by the time of my arrival minutes later the pt's HR had recovered to the 70s and BP was 115/86.  Review of telemetry reveals only sinus bradycardia without evidence of high-grade block.  Ms. Felicia Silva notes that she passed gas just prior to the event, and then felt flushed and lightheaded during it.  She is now asymptomatic. It seems as though she had a brief vagal episode, possibly with preceding valsalva while passing flatus.  We will continue to monitor.  Azalee CoursePaul S. Corotto MD

## 2016-11-29 LAB — CBC
HCT: 39.2 % (ref 36.0–46.0)
Hemoglobin: 12.4 g/dL (ref 12.0–15.0)
MCH: 28 pg (ref 26.0–34.0)
MCHC: 31.6 g/dL (ref 30.0–36.0)
MCV: 88.5 fL (ref 78.0–100.0)
Platelets: 291 10*3/uL (ref 150–400)
RBC: 4.43 MIL/uL (ref 3.87–5.11)
RDW: 13.7 % (ref 11.5–15.5)
WBC: 9.8 10*3/uL (ref 4.0–10.5)

## 2016-11-29 LAB — GLUCOSE, CAPILLARY
GLUCOSE-CAPILLARY: 196 mg/dL — AB (ref 65–99)
Glucose-Capillary: 204 mg/dL — ABNORMAL HIGH (ref 65–99)
Glucose-Capillary: 224 mg/dL — ABNORMAL HIGH (ref 65–99)

## 2016-11-29 LAB — LIPID PANEL
Cholesterol: 158 mg/dL (ref 0–200)
HDL: 41 mg/dL (ref >40)
LDL CALC: 94 mg/dL (ref 0–99)
Total CHOL/HDL Ratio: 3.9 RATIO
Triglycerides: 116 mg/dL (ref <150)
VLDL: 23 mg/dL (ref 0–40)

## 2016-11-29 LAB — BASIC METABOLIC PANEL
Anion gap: 13 (ref 5–15)
BUN: 13 mg/dL (ref 6–20)
CO2: 22 mmol/L (ref 22–32)
Calcium: 9.3 mg/dL (ref 8.9–10.3)
Chloride: 103 mmol/L (ref 101–111)
Creatinine, Ser: 1.06 mg/dL — ABNORMAL HIGH (ref 0.44–1.00)
GFR calc Af Amer: >60 mL/min (ref >60)
GFR, EST NON AFRICAN AMERICAN: 56 mL/min — AB (ref >60)
Glucose, Bld: 144 mg/dL — ABNORMAL HIGH (ref 65–99)
POTASSIUM: 4.1 mmol/L (ref 3.5–5.1)
SODIUM: 138 mmol/L (ref 135–145)

## 2016-11-29 LAB — TROPONIN I
TROPONIN I: 2.81 ng/mL — AB (ref <0.03)
TROPONIN I: 3.71 ng/mL — AB (ref <0.03)

## 2016-11-29 LAB — TSH: TSH: 1.965 u[IU]/mL (ref 0.350–4.500)

## 2016-11-29 LAB — HIV ANTIBODY (ROUTINE TESTING W REFLEX): HIV SCREEN 4TH GENERATION: NONREACTIVE

## 2016-11-29 LAB — HEMOGLOBIN A1C
Hgb A1c MFr Bld: 8.8 % — ABNORMAL HIGH (ref 4.8–5.6)
Mean Plasma Glucose: 206 mg/dL

## 2016-11-29 NOTE — Progress Notes (Signed)
Progress Note  Patient Name: Felicia Silva Date of Encounter: 11/29/2016  Primary Cardiologist: Dr Excell Seltzerooper  Subjective   No chest pain or dyspnea  Inpatient Medications    Scheduled Meds: . amLODipine  5 mg Oral QHS  . aspirin  81 mg Oral Daily  . atorvastatin  80 mg Oral q1800  . enoxaparin (LOVENOX) injection  40 mg Subcutaneous Q24H  . glipiZIDE  10 mg Oral BID WC  . hydrochlorothiazide  12.5 mg Oral Daily  . insulin aspart  0-15 Units Subcutaneous TID WC  . irbesartan  300 mg Oral Daily  . metoprolol tartrate  25 mg Oral BID  . sodium chloride flush  3 mL Intravenous Q12H  . ticagrelor  90 mg Oral BID   Continuous Infusions: . sodium chloride Stopped (11/29/16 0100)   PRN Meds: sodium chloride, acetaminophen, nitroGLYCERIN, ondansetron (ZOFRAN) IV, sodium chloride flush   Vital Signs    Vitals:   11/29/16 0100 11/29/16 0200 11/29/16 0300 11/29/16 0400  BP: 118/81 136/83 (!) 153/85   Pulse: 72 87 81 83  Resp: 14 16 18 18   Temp:   98 F (36.7 C)   TempSrc:      SpO2: 97% 97% 98% 99%  Weight:      Height:        Intake/Output Summary (Last 24 hours) at 11/29/16 0720 Last data filed at 11/29/16 0100  Gross per 24 hour  Intake              230 ml  Output              450 ml  Net             -220 ml   Filed Weights   11/28/16 1242  Weight: 120.2 kg (265 lb)    Telemetry    Sinus - Personally Reviewed    Physical Exam   GEN: No acute distress.  Obese Neck: Supple Cardiac: RRR, no murmurs, rubs, or gallops.  Respiratory: Clear to auscultation bilaterally. GI: Soft, nontender, non-distended  MS: No edema; Radial cath site with no hematoma; mild ecchymosis Neuro:  Nonfocal  Psych: Normal affect   Labs    Chemistry Recent Labs Lab 11/28/16 1242 11/28/16 1816  NA 135  --   K 4.5  --   CL 102  --   CO2 24  --   GLUCOSE 212*  --   BUN 20  --   CREATININE 1.23* 1.07*  CALCIUM 9.7  --   PROT 7.6  --   ALBUMIN 4.1  --   AST 38  --     ALT 30  --   ALKPHOS 110  --   BILITOT 0.7  --   GFRNONAA 47* 56*  GFRAA 55* >60  ANIONGAP 9  --      Hematology Recent Labs Lab 11/28/16 1242 11/28/16 1816 11/29/16 0529  WBC 9.0 10.5 9.8  RBC 4.63 4.45 4.43  HGB 13.5 12.7 12.4  HCT 40.8 39.5 39.2  MCV 88.1 88.8 88.5  MCH 29.2 28.5 28.0  MCHC 33.1 32.2 31.6  RDW 13.4 13.6 13.7  PLT 311 307 291    Cardiac Enzymes Recent Labs Lab 11/28/16 1816 11/28/16 2336  TROPONINI 2.05* 3.71*    Recent Labs Lab 11/28/16 1244  TROPIPOC 0.51*     BNP Recent Labs Lab 11/28/16 1817  BNP 69.6      Radiology    Dg Chest 2 View  Result Date: 11/28/2016  CLINICAL DATA:  Chest pressure EXAM: CHEST  2 VIEW COMPARISON:  None. FINDINGS: Low lung volumes. Heart and mediastinal contours are within normal limits. No focal opacities or effusions. No acute bony abnormality. IMPRESSION: Low volumes.  No active cardiopulmonary disease. Electronically Signed   By: Charlett Nose M.D.   On: 11/28/2016 13:40   Ct Angio Chest Aorta W And/or Wo Contrast  Result Date: 11/28/2016 CLINICAL DATA:  Anterior chest pain radiating to the back for 1 day EXAM: CT ANGIOGRAPHY CHEST WITH CONTRAST TECHNIQUE: Multidetector CT imaging of the chest was performed using the standard protocol during bolus administration of intravenous contrast. Multiplanar CT image reconstructions and MIPs were obtained to evaluate the vascular anatomy. CONTRAST:  100 cc Isovue 370 COMPARISON:  None. FINDINGS: Cardiovascular: There are no filling defects in the pulmonary arterial tree to suggest acute pulmonary thromboembolism. No evidence of aortic dissection or aneurysm. Distal LAD territory coronary artery calcifications are present. Mediastinum/Nodes: No abnormal mediastinal adenopathy. Possible 1.2 cm right thyroid nodule. Lungs/Pleura: No pneumothorax or pleural effusion. Clear lungs. Upper Abdomen: Diffuse hepatic steatosis.  Postcholecystectomy. Musculoskeletal: No vertebral  compression deformity. Anterior bridging osteophytes in the mid and lower thoracic spine. Posterior osteophytic ridging at C5-6 and C6-7 in the lower cervical spine. Review of the MIP images confirms the above findings. IMPRESSION: No evidence of acute pulmonary thromboembolism Possible right thyroid nodule.  Thyroid ultrasound is recommended. Electronically Signed   By: Jolaine Click M.D.   On: 11/28/2016 14:26    Patient Profile     61 y.o. year-old female with diabetes and hypertension presented with acute inferior myocardial infarction. Patient is now status post PCI of left circumflex. LV function is preserved.  Assessment & Plan    NSTEMI-Patient is doing well this morning. No recurrent chest pain status post PCI of left circumflex. Continue aspirin, brilinta, statin and metoprolol. Ambulate. Transfer to telemetry. Discharge tomorrow morning if stable.  Hypertension-blood pressure is controlled. Continue present medications including metoprolol and ARB.  Thyroid nodule-patient will need outpatient ultrasound of thyroid.  Obesity-we discussed the importance of exercise, diet and weight loss.  Diabetes mellitus-follow CBGs. Continue Glucotrol. Resume Glucophage 48 hours following catheterization. Follow-up primary care as outpatient.  Signed, Olga Millers, MD  11/29/2016, 7:20 AM

## 2016-11-29 NOTE — Progress Notes (Signed)
Pt tx to 2 west per MD order, pt verbalized understanding of tx, report called to receiving RN, [facility], all questions answered

## 2016-11-30 ENCOUNTER — Other Ambulatory Visit: Payer: Self-pay | Admitting: Cardiology

## 2016-11-30 ENCOUNTER — Encounter (HOSPITAL_COMMUNITY): Payer: Self-pay | Admitting: Cardiovascular Disease

## 2016-11-30 DIAGNOSIS — E1169 Type 2 diabetes mellitus with other specified complication: Secondary | ICD-10-CM

## 2016-11-30 DIAGNOSIS — Z79899 Other long term (current) drug therapy: Secondary | ICD-10-CM

## 2016-11-30 DIAGNOSIS — E785 Hyperlipidemia, unspecified: Secondary | ICD-10-CM

## 2016-11-30 HISTORY — DX: Type 2 diabetes mellitus with other specified complication: E11.69

## 2016-11-30 HISTORY — DX: Hyperlipidemia, unspecified: E78.5

## 2016-11-30 LAB — POCT ACTIVATED CLOTTING TIME
ACTIVATED CLOTTING TIME: 219 s
ACTIVATED CLOTTING TIME: 263 s

## 2016-11-30 LAB — BASIC METABOLIC PANEL
ANION GAP: 9 (ref 5–15)
BUN: 21 mg/dL — AB (ref 6–20)
CALCIUM: 9.1 mg/dL (ref 8.9–10.3)
CO2: 25 mmol/L (ref 22–32)
Chloride: 102 mmol/L (ref 101–111)
Creatinine, Ser: 1.32 mg/dL — ABNORMAL HIGH (ref 0.44–1.00)
GFR calc Af Amer: 50 mL/min — ABNORMAL LOW (ref >60)
GFR, EST NON AFRICAN AMERICAN: 43 mL/min — AB (ref >60)
GLUCOSE: 179 mg/dL — AB (ref 65–99)
POTASSIUM: 4.4 mmol/L (ref 3.5–5.1)
SODIUM: 136 mmol/L (ref 135–145)

## 2016-11-30 LAB — GLUCOSE, CAPILLARY
GLUCOSE-CAPILLARY: 138 mg/dL — AB (ref 65–99)
GLUCOSE-CAPILLARY: 165 mg/dL — AB (ref 65–99)
GLUCOSE-CAPILLARY: 143 mg/dL — AB (ref 65–99)
Glucose-Capillary: 204 mg/dL — ABNORMAL HIGH (ref 65–99)

## 2016-11-30 MED ORDER — TICAGRELOR 90 MG PO TABS: 90.0000 mg | ORAL_TABLET | Freq: Two times a day (BID) | ORAL | 10 refills | Status: DC

## 2016-11-30 MED ORDER — ATORVASTATIN CALCIUM 80 MG PO TABS: 80.0000 mg | ORAL_TABLET | Freq: Every day | ORAL | 6 refills | Status: DC

## 2016-11-30 MED ORDER — METOPROLOL TARTRATE 25 MG PO TABS
25.0000 mg | ORAL_TABLET | Freq: Two times a day (BID) | ORAL | 3 refills | Status: AC
Start: 2016-11-30 — End: ?

## 2016-11-30 MED ORDER — NITROGLYCERIN 0.4 MG SL SUBL: 0.4000 mg | SUBLINGUAL_TABLET | SUBLINGUAL | 2 refills | Status: DC | PRN

## 2016-11-30 NOTE — Discharge Summary (Signed)
Discharge Summary    Patient ID: Felicia Silva,  MRN: [id number], DOB/AGE: April 30, 1956 61 y.o.  Admit date: 11/28/2016 Discharge date: 11/30/2016  Primary Care Provider: Benita Stabile Primary Cardiologist: Excell Seltzer   Discharge Diagnoses    Active Problems:   Acute ST elevation myocardial infarction (STEMI) involving left circumflex coronary artery [facility])   STEMI involving left circumflex coronary artery (HCC)   ST elevation myocardial infarction involving left circumflex coronary artery (HCC)   Hyperlipemia   Allergies Allergies  Allergen Reactions  . Shellfish Allergy Itching and Swelling    Diagnostic Studies/Procedures    LHC: 11/28/16  Conclusion   1. Acute MI involving the left circumflex, treated successfully with primary PCI using a 3.0 x 16 mm Promus DES 2. Moderate nonobstructive stenosis of the mid LAD 3. Widely patent dominant RCA with minimal irregularities 4. Mild segmental contraction abnormality the left ventricle with preserved overall LV systolic function  Recommendations: Dual antiplatelet therapy with aspirin and brilinta 12 months as tolerated. If no immediate complications arise, anticipate hospital discharge Monday morning (48 hours). Initiate post MI medical therapy.   _____________   History of Present Illness     61 y.o. female w/ PMHx significant for HTN, Type II DM, obesity, who presented to [facility] on 11/28/2016 with complaints of chest pain.  The patient presented with intermittent chest discomfort occurring over the last 3-4 days. She has a history of sharp chest pains, but yesterday she developed pressure-like pain in the center of her chest. She was seen by her primary care physician's office. Initially it was recommended that she go to the emergency room, but she was evaluated as an outpatient in the office and she had a pre-existing appointment. She had chest pain on and off the night prior to admission and it became more  severe that morning. She ultimately went to the emergency department at [facility] for evaluation. Her EKG was borderline with 1 mm of inferior ST elevation. Her cardiac markers were abnormal. Considering the entire clinical picture, it was to elected to call a code STEMI and bring her directly from [facility] to the cardiac catheterization lab.  On arrival, the patient continued to have mild chest discomfort described as a pressure-like sensation in the center of her chest into the left arm. She also complains of headache. She was on a nitroglycerin drip because of marked hypertension. She denied nausea, vomiting, or shortness of breath. She had diaphoresis the day prior to admission, but none the day of. No past history of similar symptoms. She had a CAT scan of the chest the morning of admission demonstrating no evidence for aortic dissection or pulmonary embolism.  Hospital Course     She underwent LHC with Dr. Excell Seltzer noted above with DES x1 placed to the LCx, with moderate nonobstructive stenosis in the LAD. Plan for DAPT with ASA/Brilinta for at least 12 months. Also placed on high dose statin and metoprolol. She was transferred from the unit on 11/29/16. No further episodes of chest pain. Worked well with cardiac rehab. CT chest did note a possible right thyroid nodule, planned to have her follow up with PCP for outpatient Thyroid US. Did have a mild bump in her Cr at the time of discharge to 1.3. Instructed to hold her Benicar at the time of d/c until her follow up appt. Plan for labs on 12/04/16.   She was seen by Dr. Allyson Sabal and determined for discharge home.  Follow up in the office has been arranged. Medications are listed below.   General: Well developed, well nourished, female appearing in no acute distress. Head: Normocephalic, atraumatic.  Neck: Supple without bruits, JVD. Lungs:  Resp regular and unlabored, CTA. Heart: RRR, S1, S2, no S3, S4, or murmur; no rub. Abdomen:  Soft, non-tender, non-distended with normoactive bowel sounds. No hepatomegaly. No rebound/guarding. No obvious abdominal masses. Extremities: No clubbing, cyanosis, edema. Distal pedal pulses are 2+ bilaterally. R radial cath site stable without bruising or hematoma Neuro: Alert and oriented X 3. Moves all extremities spontaneously. Psych: Normal affect.  _____________  Discharge Vitals Blood pressure 121/78, pulse 96, temperature 97.9 F (36.6 C), temperature source Oral, resp. rate 18, height 5\' 6"  (1.676 m), weight 265 lb (120.2 kg), SpO2 97 %.  Filed Weights   11/28/16 1242  Weight: 265 lb (120.2 kg)    Labs & Radiologic Studies    CBC  Recent Labs  11/28/16 1242 11/28/16 1816 11/29/16 0529  WBC 9.0 10.5 9.8  NEUTROABS 5.3  --   --   HGB 13.5 12.7 12.4  HCT 40.8 39.5 39.2  MCV 88.1 88.8 88.5  PLT 311 307 291   Basic Metabolic Panel  Recent Labs  11/29/16 0528 11/30/16 0254  NA 138 136  K 4.1 4.4  CL 103 102  CO2 22 25  GLUCOSE 144* 179*  BUN 13 21*  CREATININE 1.06* 1.32*  CALCIUM 9.3 9.1   Liver Function Tests  Recent Labs  11/28/16 1242  AST 38  ALT 30  ALKPHOS 110  BILITOT 0.7  PROT 7.6  ALBUMIN 4.1   No results for input(s): LIPASE, AMYLASE in the last 72 hours. Cardiac Enzymes  Recent Labs  11/28/16 1816 11/28/16 2336 11/29/16 0528  TROPONINI 2.05* 3.71* 2.81*   BNP Invalid input(s): POCBNP D-Dimer No results for input(s): DDIMER in the last 72 hours. Hemoglobin A1C  Recent Labs  11/28/16 1817  HGBA1C 8.8*   Fasting Lipid Panel  Recent Labs  11/29/16 0528  CHOL 158  HDL 41  LDLCALC 94  TRIG 116  CHOLHDL 3.9   Thyroid Function Tests  Recent Labs  11/29/16 0528  TSH 1.965   _____________  Dg Chest 2 View  Result Date: 11/28/2016 CLINICAL DATA:  Chest pressure EXAM: CHEST  2 VIEW COMPARISON:  None. FINDINGS: Low lung volumes. Heart and mediastinal contours are within normal limits. No focal opacities or  effusions. No acute bony abnormality. IMPRESSION: Low volumes.  No active cardiopulmonary disease. Electronically Signed   By: Charlett Nose M.D.   On: 11/28/2016 13:40   Ct Angio Chest Aorta W And/or Wo Contrast  Result Date: 11/28/2016 CLINICAL DATA:  Anterior chest pain radiating to the back for 1 day EXAM: CT ANGIOGRAPHY CHEST WITH CONTRAST TECHNIQUE: Multidetector CT imaging of the chest was performed using the standard protocol during bolus administration of intravenous contrast. Multiplanar CT image reconstructions and MIPs were obtained to evaluate the vascular anatomy. CONTRAST:  100 cc Isovue 370 COMPARISON:  None. FINDINGS: Cardiovascular: There are no filling defects in the pulmonary arterial tree to suggest acute pulmonary thromboembolism. No evidence of aortic dissection or aneurysm. Distal LAD territory coronary artery calcifications are present. Mediastinum/Nodes: No abnormal mediastinal adenopathy. Possible 1.2 cm right thyroid nodule. Lungs/Pleura: No pneumothorax or pleural effusion. Clear lungs. Upper Abdomen: Diffuse hepatic steatosis.  Postcholecystectomy. Musculoskeletal: No vertebral compression deformity. Anterior bridging osteophytes in the mid and lower thoracic spine. Posterior osteophytic ridging at C5-6 and C6-7 in  the lower cervical spine. Review of the MIP images confirms the above findings. IMPRESSION: No evidence of acute pulmonary thromboembolism Possible right thyroid nodule.  Thyroid ultrasound is recommended. Electronically Signed   By: Jolaine ClickArthur  Hoss M.D.   On: 11/28/2016 14:26   Disposition   Pt is being discharged home today in good condition.  Follow-up Plans & Appointments    Follow-up Information    YorkshireBhagat, Sharrell KuBhavinkumar, [place] Follow up on 12/09/2016.   Specialty:  Cardiology Why:  at 1:30pm for your follow up appt.  Contact information: [address] [place] [address] [phone]        Benita StabileHall, John Z, MD Follow up.   Specialty:  Internal  Medicine Why:  Regarding thyroid nodule seen on CT scan for possible thyroid US.  Contact information: [address] [place] [address] [phone]        [facility] [address] Follow up on 12/04/2016.   Specialty:  Cardiology Why:  Please come in for follow up labs. Contact information: [address], [address] [place] [id number] [phone]         Discharge Instructions    Amb Referral to Cardiac Rehabilitation    Complete by:  As directed    Diagnosis:   Coronary Stents PTCA STEMI     Call MD for:  redness, tenderness, or signs of infection (pain, swelling, redness, odor or green/yellow discharge around incision site)    Complete by:  As directed    Diet - low sodium heart healthy    Complete by:  As directed    Discharge instructions    Complete by:  As directed    Radial Site Care Refer to this sheet in the next few weeks. These instructions provide you with information on caring for yourself after your procedure. Your caregiver may also give you more specific instructions. Your treatment has been planned according to current medical practices, but problems sometimes occur. Call your caregiver if you have any problems or questions after your procedure. HOME CARE INSTRUCTIONS You may shower the day after the procedure.Remove the bandage (dressing) and gently wash the site with plain soap and water.Gently pat the site dry.  Do not apply powder or lotion to the site.  Do not submerge the affected site in water for 3 to 5 days.  Inspect the site at least twice daily.  Do not flex or bend the affected arm for 24 hours.  No lifting over 5 pounds (2.3 kg) for 5 days after your procedure.  Do not drive home if you are discharged the same day of the procedure. Have someone else drive you.  You may drive 24 hours after the procedure unless otherwise instructed by your caregiver.  What to expect: Any bruising will usually fade within 1 to  2 weeks.  Blood that collects in the tissue (hematoma) may be painful to the touch. It should usually decrease in size and tenderness within 1 to 2 weeks.  SEEK IMMEDIATE MEDICAL CARE IF: You have unusual pain at the radial site.  You have redness, warmth, swelling, or pain at the radial site.  You have drainage (other than a small amount of blood on the dressing).  You have chills.  You have a fever or persistent symptoms for more than 72 hours.  You have a fever and your symptoms suddenly get worse.  Your arm becomes pale, cool, tingly, or numb.  You have heavy bleeding from the site.  Hold pressure on the site.   Discharge instructions    Complete by:  As directed    Please hold on taking your Benicar until your follow appt, after having your labs draw as your kidney function was elevated.   Increase activity slowly    Complete by:  As directed       Discharge Medications   Current Discharge Medication List    START taking these medications   Details  atorvastatin (LIPITOR) 80 MG tablet Take 1 tablet (80 mg total) by mouth daily at 6 PM. Qty: 30 tablet, Refills: 6    metoprolol tartrate (LOPRESSOR) 25 MG tablet Take 1 tablet (25 mg total) by mouth 2 (two) times daily. Qty: 60 tablet, Refills: 3    nitroGLYCERIN (NITROSTAT) 0.4 MG SL tablet Place 1 tablet (0.4 mg total) under the tongue every 5 (five) minutes x 3 doses as needed for chest pain. Qty: 25 tablet, Refills: 2    ticagrelor (BRILINTA) 90 MG TABS tablet Take 1 tablet (90 mg total) by mouth 2 (two) times daily. Qty: 60 tablet, Refills: 10      CONTINUE these medications which have NOT CHANGED   Details  amLODipine (NORVASC) 5 MG tablet Take 5 mg by mouth at bedtime. Refills: 0    aspirin 81 MG chewable tablet Chew 81 mg by mouth daily.    glipiZIDE (GLUCOTROL) 10 MG tablet Take 1 tablet by mouth 2 (two) times daily. Refills: 0    metFORMIN (GLUCOPHAGE) 500 MG tablet Take 1,000 mg by mouth 2 (two) times daily.   Refills: 0      STOP taking these medications     BENICAR HCT 40-12.5 MG tablet          Aspirin prescribed at discharge?  Yes High Intensity Statin Prescribed? (Lipitor 40-80mg  or Crestor 20-40mg ): Yes Beta Blocker Prescribed? Yes For EF <40%, was ACEI/ARB Prescribed? No: Held given mildly elevated Cr ADP Receptor Inhibitor Prescribed? (i.e. Plavix etc.-Includes Medically Managed Patients): Yes For EF <40%, Aldosterone Inhibitor Prescribed? No Was EF assessed during THIS hospitalization? Yes Was Cardiac Rehab II ordered? (Included Medically managed Patients): Yes   Outstanding Labs/Studies   Bmet 12/04/16, FLP/LFTs in 6 weeks if tolerating statin.   Duration of Discharge Encounter   Greater than 30 minutes including physician time.  Signed, Laverda Page NP-C 11/30/2016, 11:37 AM  Agree with note by Laverda Page NP-C  Postoperative day #2 posterior lateral myocardial infarction secondary to an occluded distal circumflex. Her EF is preserved. She does have moderate LAD disease, minimal RCA disease. Her troponins were minimally elevated at 3.7. She denies chest pain. Her exam is benign. Her radial puncture is well-healed. She is on dual antibiotic therapy. She is stable for discharge today. We'll arrange for her to see mid-level provider in 7-10 days and will follow-up with Dr. Excell Seltzer in 3-4 weeks.   Runell Gess, M.D., FACP, [facility], [facility] [address] [place], [place]  [address]  [phone] 11/30/2016 11:37 AM

## 2016-11-30 NOTE — Care Management Note (Signed)
Case Management Note Donn PieriniKristi Shamera Yarberry RN, BSN Unit 2W-Case Manager [phone]  Patient Details  Name: Felicia GlassBrenda K Silva MRN: [id number] Date of Birth: 1955-07-21  Subjective/Objective:  Pt admitted with STEMI s/p cath                  Action/Plan: PTA pt lived at home- independent- plan to return home- pt has been started on Brilinta- per insurance-copay cost $51.35- spoke with pt at bedside- coverage info shared- pt given both 30 day free card and the copay assist card- per pt she uses [facility] [place]-  call made to pharmacy- they have drug in stock to fill for pt today.   Expected Discharge Date:  11/30/16               Expected Discharge Plan:  Home/Self Care  In-House Referral:     Discharge planning Services  CM Consult, Medication Assistance  Post Acute Care Choice:  NA Choice offered to:  NA  DME Arranged:    DME Agency:     HH Arranged:    HH Agency:     Status of Service:  Completed, signed off  If discussed at [employer] Length of Stay Meetings, dates discussed:    Additional Comments:  Darrold SpanWebster, Yunis Voorheis Hall, RN 11/30/2016, 12:09 PM

## 2016-11-30 NOTE — Progress Notes (Signed)
CARDIAC REHAB PHASE I   PRE:  Rate/Rhythm: 88 SR    BP: sitting 140/86    SaO2: 98 RA  MODE:  Ambulation: 500 ft   POST:  Rate/Rhythm: 119 ST    BP: sitting 136/90     SaO2:   Tolerated well, feels good. HR up with distance. Ed completed with pt and husband. Voiced understanding, good reception. She still needs Brilinta copay card. [facility]. She is recently working on DM and appreciated carb counting diet.  [phone]   Harriet MassonRandi Kristan Ruble Pumphrey CES, ACSM 11/30/2016 10:33 AM

## 2016-11-30 NOTE — Progress Notes (Signed)
Per [employer] check on Brilinta Per rep at [facility] [employer]:  Brilinta 90mg  bid:  No auth required  30 day retail: 51.35  90 day retail: 60.00

## 2016-11-30 NOTE — Progress Notes (Signed)
Discharge instructions given. Pt verbalized understanding and all questions were answered.  

## 2016-12-03 ENCOUNTER — Telehealth: Payer: Self-pay | Admitting: Cardiovascular Disease

## 2016-12-03 NOTE — Telephone Encounter (Signed)
°  Follow Up  Following up on disability paperwork. Requesting a call back.

## 2016-12-04 ENCOUNTER — Other Ambulatory Visit: Payer: 59 | Admitting: *Deleted

## 2016-12-04 NOTE — Progress Notes (Signed)
Cardiology Office Note    Date:  12/09/2016   ID:  Felicia Silva, DOB 05-24-1956, MRN [id number]  PCP:  Felicia Silva, Felicia Z, MD  Cardiologist:  Dr. Excell Silva  Chief Complaint: Hospital follow up for STEMI  History of Present Illness:   Felicia Silva is a 61 y.o. female HTN, Type II DM, obesity and recent STEMI presents for follow up.   Admitted 5/19-5/21 with STEMI. Cath showed 100% stenosed LCx s/p DES x1 with moderate nonobstructive stenosis in the LAD. Plan for DAPT with ASA/Brilinta for at least 12 months. CT chest did note a possible right thyroid nodule, planned to have her follow up with PCP for outpatient Thyroid US. Did have a mild bump in her Cr at the time of discharge to 1.3. Instructed to hold her Benicar at the time of d/c until her follow up appt. Scr improved 11/14/16.  Here today for follow up. Since has been walking on her house without any chest pain or shortness of breath. She denies orthopnea, PND, syncope, lower extremity edema, melena or blood in stool or urine. Intermittent left upper sharp chest pain. Very different from MI pain. Questionable GERD related. Compliant with medication. She has approved for short from disability by [employer] until 6/30. She works as a Pharmacologistpharmacy technician.   Past Medical History:  Diagnosis Date  . CAD (coronary artery disease), native coronary artery    11/28/16 STEMI PCI DES--LCx, EF normal.   . Diabetes mellitus without complication (HCC)   . Hypertension   . Multiple thyroid nodules    on CT of chest    Past Surgical History:  Procedure Laterality Date  . ABDOMINAL SURGERY    . CHOLECYSTECTOMY    . CORONARY STENT INTERVENTION N/A 11/28/2016   Procedure: Coronary Stent Intervention;  Surgeon: Tonny Bollmanooper, Michael, MD;  [facility];  Service: Cardiovascular;  Laterality: N/A;  . HERNIA REPAIR    . LEFT HEART CATH AND CORONARY ANGIOGRAPHY N/A 11/28/2016   Procedure: Left Heart Cath and Coronary Angiography;  Surgeon:  Tonny Bollmanooper, Michael, MD;  [facility];  Service: Cardiovascular;  Laterality: N/A;  . TONSILLECTOMY      Current Medications: Prior to Admission medications   Medication Sig Start Date End Date Taking? Authorizing Provider  amLODipine (NORVASC) 5 MG tablet Take 5 mg by mouth at bedtime. 11/27/16   [provider]  aspirin 81 MG chewable tablet Chew 81 mg by mouth daily.    [provider]  atorvastatin (LIPITOR) 80 MG tablet Take 1 tablet (80 mg total) by mouth daily at 6 PM. 11/30/16   Arty Baumgartneroberts, Lindsay B, NP  glipiZIDE (GLUCOTROL) 10 MG tablet Take 1 tablet by mouth 2 (two) times daily. 11/17/16   [provider]  metFORMIN (GLUCOPHAGE) 500 MG tablet Take 1,000 mg by mouth 2 (two) times daily.  11/17/16   [provider]  metoprolol tartrate (LOPRESSOR) 25 MG tablet Take 1 tablet (25 mg total) by mouth 2 (two) times daily. 11/30/16   Arty Baumgartneroberts, Lindsay B, NP  nitroGLYCERIN (NITROSTAT) 0.4 MG SL tablet Place 1 tablet (0.4 mg total) under the tongue every 5 (five) minutes x 3 doses as needed for chest pain. 11/30/16   Arty Baumgartneroberts, Lindsay B, NP  ticagrelor (BRILINTA) 90 MG TABS tablet Take 1 tablet (90 mg total) by mouth 2 (two) times daily. 11/30/16   Arty Baumgartneroberts, Lindsay B, NP    Allergies:   Shellfish allergy   Social History   Social  History  . Marital status: Single    Spouse name: N/A  . Number of children: N/A  . Years of education: N/A   Social History Main Topics  . Smoking status: Never Smoker  . Smokeless tobacco: Never Used  . Alcohol use No  . Drug use: No  . Sexual activity: Not Asked   Other Topics Concern  . None   Social History Narrative   nonsmoker     Family History:  The patient's family history includes Heart attack (age of onset: 24) in her father; Heart murmur in her mother; Pancreatic cancer in her mother.   ROS:   Please see the history of present illness.    ROS All other systems reviewed and are  negative.   PHYSICAL EXAM:   VS:  BP (!) 156/100 (BP Location: Left Arm)   Pulse 70   Ht 5' 6.5" (1.689 m)   Wt 274 lb 1.9 oz (124.3 kg)   BMI 43.58 kg/m    GEN: Well nourished, well developed, in no acute distress  HEENT: normal  Neck: no JVD, carotid bruits, or masses Cardiac:RRR; no murmurs, rubs, or gallops,no edema  Respiratory:  clear to auscultation bilaterally, normal work of breathing GI: soft, nontender, nondistended, + BS MS: no deformity or atrophy  Skin: warm and dry, no rash Neuro:  Alert and Oriented x 3, Strength and sensation are intact Psych: euthymic mood, full affect  Wt Readings from Last 3 Encounters:  12/09/16 274 lb 1.9 oz (124.3 kg)  11/28/16 265 lb (120.2 kg)      Studies/Labs Reviewed:   EKG:  EKG is ordered today.  The ekg ordered today demonstrates NSR  Recent Labs: 11/28/2016: ALT 30; B Natriuretic Peptide 69.6 11/29/2016: Hemoglobin 12.4; Platelets 291; TSH 1.965 12/04/2016: BUN 23; Creatinine, Ser 1.09; Potassium 4.3; Sodium 140   Lipid Panel    Component Value Date/Time   CHOL 158 11/29/2016 0528   TRIG 116 11/29/2016 0528   HDL 41 11/29/2016 0528   CHOLHDL 3.9 11/29/2016 0528   VLDL 23 11/29/2016 0528   LDLCALC 94 11/29/2016 0528    Additional studies/ records that were reviewed today include:   Echocardiogram:  Cardiac Catheterization: 11/28/16 1. Acute MI involving the left circumflex, treated successfully with primary PCI using a 3.0 x 16 mm Promus DES 2. Moderate nonobstructive stenosis of the mid LAD 3. Widely patent dominant RCA with minimal irregularities 4. Mild segmental contraction abnormality the left ventricle with preserved overall LV systolic function  Recommendations: Dual antiplatelet therapy with aspirin and brilinta 12 months as tolerated. If no immediate complications arise, anticipate hospital discharge Monday morning (48 hours). Initiate post MI medical therapy.    ASSESSMENT & PLAN:    1. CAD s/p DES  to Lcx - No angina. Intermittent left upper sharp chest pain, likely related to GERD. Continue aspirin, Brilinta, statin, beta blocker.  2. HTN - Elevated. Restart Benicar-HCT 40-12.5 mg qd. Continue Norvasc 5 mg qd and lopressor 25mg  BID.   3. HLD - 11/29/2016: Cholesterol 158; HDL 41; LDL Cholesterol 94; Triglycerides 116; VLDL 23  - Continue statin. Lipid panel aid LFT in 6 weeks.   4. AKI - Scr of 1.32 at discharge. Hold Benicar. REPEAT labs showed improved kidney function. Restart Benicar-HCT for elevated BP as above. She will get BMET next week at pcp office.   5. Possible right thyroid nodule on CT of chest - follow up with PCP    Medication Adjustments/Labs and Tests Ordered: Current  medicines are reviewed at length with the patient today.  Concerns regarding medicines are outlined above.  Medication changes, Labs and Tests ordered today are listed in the Patient Instructions below. There are no Patient Instructions on file for this visit.   Lorelei Pont, [place]  12/09/2016 1:54 PM    [facility] [address], [place], [place]  [address] Phone: [phone]; Fax: [phone]

## 2016-12-04 NOTE — Telephone Encounter (Signed)
Spoke with representative at [employer] made her aware we do not have request for records.  Made my fax available.

## 2016-12-05 LAB — BASIC METABOLIC PANEL
BUN / CREAT RATIO: 21 (ref 9–23)
BUN: 23 mg/dL (ref 6–24)
CHLORIDE: 104 mmol/L (ref 96–106)
CO2: 20 mmol/L (ref 18–29)
Calcium: 9.4 mg/dL (ref 8.7–10.2)
Creatinine, Ser: 1.09 mg/dL — ABNORMAL HIGH (ref 0.57–1.00)
GFR calc non Af Amer: 56 mL/min/{1.73_m2} — ABNORMAL LOW (ref >59)
GFR, EST AFRICAN AMERICAN: 64 mL/min/{1.73_m2} (ref >59)
Glucose: 108 mg/dL — ABNORMAL HIGH (ref 65–99)
Potassium: 4.3 mmol/L (ref 3.5–5.2)
Sodium: 140 mmol/L (ref 134–144)

## 2016-12-08 ENCOUNTER — Encounter: Payer: Self-pay | Admitting: Physician Assistant

## 2016-12-09 ENCOUNTER — Ambulatory Visit (INDEPENDENT_AMBULATORY_CARE_PROVIDER_SITE_OTHER): Payer: 59 | Admitting: Physician Assistant

## 2016-12-09 ENCOUNTER — Encounter (INDEPENDENT_AMBULATORY_CARE_PROVIDER_SITE_OTHER): Payer: Self-pay

## 2016-12-09 ENCOUNTER — Encounter: Payer: Self-pay | Admitting: Physician Assistant

## 2016-12-09 VITALS — BP 156/100 | HR 70 | Ht 66.5 in | Wt 274.1 lb

## 2016-12-09 DIAGNOSIS — I2121 ST elevation (STEMI) myocardial infarction involving left circumflex coronary artery: Secondary | ICD-10-CM

## 2016-12-09 DIAGNOSIS — E782 Mixed hyperlipidemia: Secondary | ICD-10-CM | POA: Diagnosis not present

## 2016-12-09 DIAGNOSIS — E041 Nontoxic single thyroid nodule: Secondary | ICD-10-CM

## 2016-12-09 DIAGNOSIS — I10 Essential (primary) hypertension: Secondary | ICD-10-CM | POA: Diagnosis not present

## 2016-12-09 DIAGNOSIS — N179 Acute kidney failure, unspecified: Secondary | ICD-10-CM | POA: Diagnosis not present

## 2016-12-09 MED ORDER — OLMESARTAN MEDOXOMIL-HCTZ 40-12.5 MG PO TABS: 1.0000 | ORAL_TABLET | Freq: Every day | ORAL | 11 refills | Status: DC

## 2016-12-09 NOTE — Patient Instructions (Addendum)
Your physician has recommended you make the following change in your medication:  1.) start Benicar/HCT  (40/12.5)--take one tablet once a day  Your physician recommends that you return for lab work in: 6 weeks (lipids/liver)  Your physician recommends that you schedule a follow-up appointment in: 3-4 months with Dr. Excell Seltzerooper.    Addendum: FMLA paperwork given to medical records.

## 2016-12-15 ENCOUNTER — Other Ambulatory Visit (HOSPITAL_COMMUNITY): Payer: Self-pay | Admitting: Internal Medicine

## 2016-12-15 DIAGNOSIS — E041 Nontoxic single thyroid nodule: Secondary | ICD-10-CM

## 2016-12-16 ENCOUNTER — Telehealth: Payer: Self-pay | Admitting: Cardiovascular Disease

## 2016-12-16 NOTE — Telephone Encounter (Signed)
REVIEWED  PT'S  RECORDS APPEARS LATEST  TESTS   WERE  BMET   FROM 11-30-16 AND  PT  WAS  NOTIFIED OF THOSE  RESULTS  MESSAGE IS  OLD .PT   AGREES Felicia Silva/CY

## 2016-12-16 NOTE — Telephone Encounter (Signed)
Mrs.Kolar is calling about test results . Please cal

## 2016-12-22 ENCOUNTER — Ambulatory Visit (HOSPITAL_COMMUNITY)
Admission: RE | Admit: 2016-12-22 | Discharge: 2016-12-22 | Disposition: A | Discharge status: Home / Self Care | Payer: 59 | Source: Ambulatory Visit | Attending: Internal Medicine | Admitting: Internal Medicine

## 2016-12-22 DIAGNOSIS — E041 Nontoxic single thyroid nodule: Secondary | ICD-10-CM | POA: Diagnosis present

## 2016-12-22 DIAGNOSIS — E042 Nontoxic multinodular goiter: Secondary | ICD-10-CM | POA: Diagnosis not present

## 2016-12-29 ENCOUNTER — Encounter (HOSPITAL_COMMUNITY)
Admission: RE | Admit: 2016-12-29 | Discharge: 2016-12-29 | Disposition: A | Discharge status: Home / Self Care | Payer: 59 | Source: Ambulatory Visit | Attending: Cardiovascular Disease | Admitting: Cardiovascular Disease

## 2016-12-29 ENCOUNTER — Encounter (HOSPITAL_COMMUNITY): Payer: Self-pay

## 2016-12-29 VITALS — BP 160/100 | HR 77 | Ht 66.0 in | Wt 268.6 lb

## 2016-12-29 DIAGNOSIS — Z955 Presence of coronary angioplasty implant and graft: Secondary | ICD-10-CM | POA: Insufficient documentation

## 2016-12-29 DIAGNOSIS — I213 ST elevation (STEMI) myocardial infarction of unspecified site: Secondary | ICD-10-CM | POA: Diagnosis not present

## 2016-12-29 HISTORY — DX: ST elevation (STEMI) myocardial infarction of unspecified site: I21.3

## 2016-12-29 NOTE — Progress Notes (Signed)
Daily Session Note  Patient Details  Name: Felicia Silva MRN: [id number] Date of Birth: 1956/07/12 Referring Provider:     CARDIAC REHAB PHASE II ORIENTATION from 12/29/2016 in [place]  Referring Provider  Dr. Burt Knack      Encounter Date: 12/29/2016  Check In:     Session Check In - 12/29/16 1400      Check-In   Location [facility]   Staff Present [place] Angelina Pih, MS, EP, [facility], Exercise Physiologist;Gregory Luther Parody, BS, EP, Exercise Physiologist   Supervising physician immediately available to respond to emergencies See telemetry face sheet for immediately available MD   Medication changes reported     No   Fall or balance concerns reported    Yes   Comments She has fallen twice in the past year with no injuries.    Tobacco Cessation --  Never smoked   Warm-up and Cool-down Performed as group-led instruction   Resistance Training Performed Yes   VAD Patient? No     Pain Assessment   Currently in Pain? No/denies   Pain Score 0-No pain   Multiple Pain Sites No      Capillary Blood Glucose: No results found for this or any previous visit (from the past 24 hour(s)).    History  Smoking Status  . Never Smoker  Smokeless Tobacco  . Never Used    Goals Met:  Independence with exercise equipment Exercise tolerated well No report of cardiac concerns or symptoms Strength training completed today  Goals Unmet:  Not Applicable  Comments: Check out 1630   Dr. Kate Sable is Medical Director for [place] and Pulmonary Rehab.

## 2016-12-29 NOTE — Progress Notes (Signed)
Cardiac/Pulmonary Rehab Medication Review by a Pharmacist  Does the patient  feel that his/her medications are working for him/her?  no  Has the patient been experiencing any side effects to the medications prescribed?  no  Does the patient measure his/her own blood pressure or blood glucose at home?  yes   Does the patient have any problems obtaining medications due to transportation or finances?   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  She has excellent potential for compliance.  She asked good questions regarding her medications    Felicia Silva, Felicia Silva 12/29/2016 3:30 PM

## 2016-12-29 NOTE — Progress Notes (Signed)
Cardiac Individual Treatment Plan  Patient Details  Name: Felicia GlassBrenda K Liebman MRN: [id number] Date of Birth: 1956/06/24 Referring Provider:     CARDI[facility]  Referring Provider  Dr. Excell Seltzerooper      Initial Encounter Date:    CARDIAC REHAB PHASE II OR[facility]ATION [facility]/2016 [facility]  Date  12/29/16  Referring Provider  Dr. Excell Seltzerooper      Visit Diagnosis: ST elevation myocardial infarction (STEMI), unspecified artery [facility](HCC)  Status post coronary artery stent placement  Patient's Home Medications on Admission:  Current Outpatient Prescriptions:  .  amLODipine (NORVASC) 5 MG tablet, Take 5 mg by mouth at bedtime., Disp: , Rfl: 0 .  aspirin 81 MG chewable tablet, Chew 81 mg by mouth daily., Disp: , Rfl:  .  atorvastatin (LIPITOR) 80 MG tablet, Take 1 tablet (80 mg total) by mouth daily at 6 PM., Disp: 30 tablet, Rfl: 6 .  glipiZIDE (GLUCOTROL) 10 MG tablet, Take 1 tablet by mouth 2 (two) times daily., Disp: , Rfl: 0 .  metFORMIN (GLUCOPHAGE) 500 MG tablet, Take 1,000 mg by mouth 2 (two) times daily. , Disp: , Rfl: 0 .  metoprolol tartrate (LOPRESSOR) 25 MG tablet, Take 1 tablet (25 mg total) by mouth 2 (two) times daily., Disp: 60 tablet, Rfl: 3 .  nitroGLYCERIN (NITROSTAT) 0.4 MG SL tablet, Place 1 tablet (0.4 mg total) under the tongue every 5 (five) minutes x 3 doses as needed for chest pain., Disp: 25 tablet, Rfl: 2 .  olmesartan-hydrochlorothiazide (BENICAR HCT) 40-12.5 MG tablet, Take 1 tablet by mouth daily., Disp: 30 tablet, Rfl: 11 .  ticagrelor (BRILINTA) 90 MG TABS tablet, Take 1 tablet (90 mg total) by mouth 2 (two) times daily., Disp: 60 tablet, Rfl: 10  Past Medical History: Past Medical History:  Diagnosis Date  . CAD (coronary artery disease), native coronary artery    11/28/16 STEMI PCI DES--LCx, EF normal.   . Diabetes mellitus without complication (HCC)   . Hypertension    . Multiple thyroid nodules    on CT of chest  . STEMI (ST elevation myocardial infarction) (HCC)     Tobacco Use: History  Smoking Status  . Never Smoker  Smokeless Tobacco  . Never Used    Labs: Recent Review Flowsheet Data    Labs for ITP Cardiac and Pulmonary Rehab Latest Ref Rng & Units 11/28/2016 11/29/2016   Cholestrol 0 - 200 mg/dL 409(W205(H) 119158   LDLCALC 0 - 99 mg/dL 147(W121(H) 94   HDL >29>40 mg/dL 48 41   Trlycerides <562<150 mg/dL 130(Q181(H) 657116   Hemoglobin A1c 4.8 - 5.6 % 8.8(H) -      Capillary Blood Glucose: Lab Results  Component Value Date   GLUCAP 204 (H) 11/30/2016   GLUCAP 143 (H) 11/30/2016   GLUCAP 165 (H) 11/29/2016   GLUCAP 224 (H) 11/29/2016   GLUCAP 204 (H) 11/29/2016     Exercise Target Goals: Date: 12/29/16  Exercise Program Goal: Individual exercise prescription set with THRR, safety & activity barriers. Participant demonstrates ability to understand and report RPE using BORG scale, to self-measure pulse accurately, and to acknowledge the importance of the exercise prescription.  Exercise Prescription Goal: Starting with aerobic activity 30 plus minutes a day, 3 days per week for initial exercise prescription. Provide home exercise prescription and guidelines that participant acknowledges understanding prior to discharge.  Activity Barriers & Risk Stratification:     Activity Barriers & Cardiac  Risk Stratification - 12/29/16 1706      Activity Barriers & Cardiac Risk Stratification   Activity Barriers None   Cardiac Risk Stratification High      6 Minute Walk:     6 Minute Walk    Row Name 12/29/16 1552         6 Minute Walk   Phase Initial     Distance 1500 feet     Distance % Change 0 %     Walk Time 6 minutes     # of Rest Breaks 0     MPH 2.84     METS 3.17     RPE 11     Perceived Dyspnea  13     VO2 Peak 10.57     Symptoms No     Resting HR 77 bpm     Resting BP 160/100     Max Ex. HR 118 bpm     Max Ex. BP 120/80     2  Minute Post BP 126/82        Oxygen Initial Assessment:   Oxygen Re-Evaluation:   Oxygen Discharge (Final Oxygen Re-Evaluation):   Initial Exercise Prescription:     Initial Exercise Prescription - 12/29/16 1500      Date of Initial Exercise RX and Referring Provider   Date 12/29/16   Referring Provider Dr. Excell Seltzerooper     Treadmill   MPH 1.5   Grade 0   Minutes 15   METs 2.1     NuStep   Level 2   SPM 13   Minutes 20   METs 1.8     Prescription Details   Frequency (times per week) 3   Duration Progress to 30 minutes of continuous aerobic without signs/symptoms of physical distress     Intensity   THRR 40-80% of Max Heartrate [phone]   Ratings of Perceived Exertion 11-13   Perceived Dyspnea 0-4     Progression   Progression Continue progressive overload as per policy without signs/symptoms or physical distress.     Resistance Training   Training Prescription Yes   Weight 1   Reps 10-15      Perform Capillary Blood Glucose checks as needed.  Exercise Prescription Changes:   Exercise Comments:   Exercise Goals and Review:      Exercise Goals    Row Name 12/29/16 1659             Exercise Goals   Increase Physical Activity Yes       Intervention Provide advice, education, support and counseling about physical activity/exercise needs.;Develop an individualized exercise prescription for aerobic and resistive training based on initial evaluation findings, risk stratification, comorbidities and participant's personal goals.       Expected Outcomes Achievement of increased cardiorespiratory fitness and enhanced flexibility, muscular endurance and strength shown through measurements of functional capacity and personal statement of participant.       Increase Strength and Stamina Yes       Intervention Provide advice, education, support and counseling about physical activity/exercise needs.;Develop an individualized exercise prescription for aerobic  and resistive training based on initial evaluation findings, risk stratification, comorbidities and participant's personal goals.       Expected Outcomes Achievement of increased cardiorespiratory fitness and enhanced flexibility, muscular endurance and strength shown through measurements of functional capacity and personal statement of participant.          Exercise Goals Re-Evaluation :    Discharge Exercise Prescription (Final  Exercise Prescription Changes):   Nutrition:  Target Goals: Understanding of nutrition guidelines, daily intake of sodium 1500mg , cholesterol 200mg , calories 30% from fat and 7% or less from saturated fats, daily to have 5 or more servings of fruits and vegetables.  Biometrics:     Pre Biometrics - 12/29/16 1501      Pre Biometrics   Height 5\' 6"  (1.676 m)   Waist Circumference 46.5 inches   Hip Circumference 49.5 inches   Waist to Hip Ratio 0.94 %   BMI (Calculated) 43.4   Triceps Skinfold 27 mm   % Body Fat 50.1 %   Grip Strength 49.8 kg   Flexibility 0 in   Single Leg Stand 2 seconds       Nutrition Therapy Plan and Nutrition Goals:   Nutrition Discharge: Rate Your Plate Scores:     Nutrition Assessments - 12/29/16 1706      MEDFICTS Scores   Pre Score 27      Nutrition Goals Re-Evaluation:   Nutrition Goals Discharge (Final Nutrition Goals Re-Evaluation):   Psychosocial: Target Goals: Acknowledge presence or absence of significant depression and/or stress, maximize coping skills, provide positive support system. Participant is able to verbalize types and ability to use techniques and skills needed for reducing stress and depression.  Initial Review & Psychosocial Screening:     Initial Psych Review & Screening - 12/29/16 1707      Initial Review   Current issues with None Identified     Family Dynamics   Good Support System? Yes     Barriers   Psychosocial barriers to participate in program There are no identifiable  barriers or psychosocial needs.     Screening Interventions   Interventions Encouraged to exercise      Quality of Life Scores:     Quality of Life - 12/29/16 1506      Quality of Life Scores   Health/Function Pre 15.6 %   Socioeconomic Pre 24.19 %   Psych/Spiritual Pre 28.29 %   Family Pre 21.6 %   GLOBAL Pre 20.96 %      PHQ-9: Recent Review Flowsheet Data    Depression screen [facility] 2/9 12/29/2016   Decreased Interest 0   Down, Depressed, Hopeless 0   PHQ - 2 Score 0   Altered sleeping 2   Tired, decreased energy 2   Change in appetite 0   Feeling bad or failure about yourself  0   Trouble concentrating 0   Moving slowly or fidgety/restless 0   Suicidal thoughts 0   PHQ-9 Score 4   Difficult doing work/chores Not difficult at all     Interpretation of Total Score  Total Score Depression Severity:  1-4 = Minimal depression, 5-9 = Mild depression, 10-14 = Moderate depression, 15-19 = Moderately severe depression, 20-27 = Severe depression   Psychosocial Evaluation and Intervention:     Psychosocial Evaluation - 12/29/16 1707      Psychosocial Evaluation & Interventions   Interventions Encouraged to exercise with the program and follow exercise prescription   Continue Psychosocial Services  No Follow up required      Psychosocial Re-Evaluation:   Psychosocial Discharge (Final Psychosocial Re-Evaluation):   Vocational Rehabilitation: Provide vocational rehab assistance to qualifying candidates.   Vocational Rehab Evaluation & Intervention:     Vocational Rehab - 12/29/16 1655      Initial Vocational Rehab Evaluation & Intervention   Assessment shows need for Vocational Rehabilitation No      Education:  Education Goals: Education classes will be provided on a weekly basis, covering required topics. Participant will state understanding/return demonstration of topics presented.  Learning Barriers/Preferences:     Learning Barriers/Preferences -  12/29/16 1654      Learning Barriers/Preferences   Learning Barriers Hearing   Learning Preferences Written Material;Computer/Internet      Education Topics: Hypertension, Hypertension Reduction -Define heart disease and high blood pressure. Discus how high blood pressure affects the body and ways to reduce high blood pressure.   Exercise and Your Heart -Discuss why it is important to exercise, the FITT principles of exercise, normal and abnormal responses to exercise, and how to exercise safely.   Angina -Discuss definition of angina, causes of angina, treatment of angina, and how to decrease risk of having angina.   Cardiac Medications -Review what the following cardiac medications are used for, how they affect the body, and side effects that may occur when taking the medications.  Medications include Aspirin, Beta blockers, calcium channel blockers, ACE Inhibitors, angiotensin receptor blockers, diuretics, digoxin, and antihyperlipidemics.   Congestive Heart Failure -Discuss the definition of CHF, how to live with CHF, the signs and symptoms of CHF, and how keep track of weight and sodium intake.   Heart Disease and Intimacy -Discus the effect sexual activity has on the heart, how changes occur during intimacy as we age, and safety during sexual activity.   Smoking Cessation / COPD -Discuss different methods to quit smoking, the health benefits of quitting smoking, and the definition of COPD.   Nutrition I: Fats -Discuss the types of cholesterol, what cholesterol does to the heart, and how cholesterol levels can be controlled.   Nutrition II: Labels -Discuss the different components of food labels and how to read food label   Heart Parts and Heart Disease -Discuss the anatomy of the heart, the pathway of blood circulation through the heart, and these are affected by heart disease.   Stress I: Signs and Symptoms -Discuss the causes of stress, how stress may lead to  anxiety and depression, and ways to limit stress.   Stress II: Relaxation -Discuss different types of relaxation techniques to limit stress.   Warning Signs of Stroke / TIA -Discuss definition of a stroke, what the signs and symptoms are of a stroke, and how to identify when someone is having stroke.   Knowledge Questionnaire Score:     Knowledge Questionnaire Score - 12/29/16 1654      Knowledge Questionnaire Score   Pre Score 24/24      Core Components/Risk Factors/Patient Goals at Admission:     Personal Goals and Risk Factors at Admission - 12/29/16 1659      Core Components/Risk Factors/Patient Goals on Admission    Weight Management Yes   Intervention Weight Management/Obesity: Establish reasonable short term and long term weight goals.   Admit Weight 268 lb 9.6 oz (121.8 kg)   Goal Weight: Short Term 258 lb 9.6 oz (117.3 kg)   Goal Weight: Long Term 248 lb 9.6 oz (112.8 kg)   Expected Outcomes Short Term: Continue to assess and modify interventions until short term weight is achieved;Long Term: Adherence to nutrition and physical activity/exercise program aimed toward attainment of established weight goal   Personal Goal Other Yes   Personal Goal Lose 20lbs while in program and continue losing after the program to reach 50lbs or more. Gain strength and endurance.    Intervention Attend CR 3 x week and supplement with home exercise 2 x week.  Expected Outcomes Reach personal goals.       Core Components/Risk Factors/Patient Goals Review:      Goals and Risk Factor Review    Row Name 12/29/16 1706             Core Components/Risk Factors/Patient Goals Review   Personal Goals Review Weight Management/Obesity          Core Components/Risk Factors/Patient Goals at Discharge (Final Review):      Goals and Risk Factor Review - 12/29/16 1706      Core Components/Risk Factors/Patient Goals Review   Personal Goals Review Weight Management/Obesity       ITP Comments:     ITP Comments    Row Name 12/29/16 1656           ITP Comments Mrs. Laidlaw is a 61 year old female that is starting our program. She has a hearing impediment. It will not keep her from exercising. she is in the process of getting hearing aides.           Comments: Patient arrived for 1st visit/orientation/education at 1400. Patient was referred to CR by Dr. Tonny Bollman due to STEMI (I21.3) and stent placement (Z95.5). During orientation advised patient on arrival and appointment times what to wear, what to do before, during and after exercise. Reviewed attendance and class policy. Talked about inclement weather and class consultation policy. Pt is scheduled to return Cardiac Rehab on 01/04/17 at 0815. Pt was advised to come to class 15 minutes before class starts. Patient was also given instructions on meeting with the dietician and attending the Family Structure classes. Pt is eager to get started. Patient participated in warm up stretches followed by light weights and resistance bands. Patient was able to complete 6 minute walk test. Patient had mild SOB during test. SOB subsided after 2 minute rest. No pain or any other S/S.  Patient was measured for the equipment. Discussed equipment safety with patient. Took patient pre-anthropometric measurements. Patient finished visit at 1630.

## 2016-12-30 NOTE — Progress Notes (Signed)
Cardiac Individual Treatment Plan  Patient Details  Name: Felicia Silva MRN: [id number] Date of Birth: 1956/06/24 Referring Provider:     CARDI[facility] RE[facility]  Referring Provider  Dr. Excell Seltzerooper      Initial Encounter Date:    CARDIAC REHAB PHASE II OR[facility]ATION [facility]2016 [facility]  Date  12/29/16  Referring Provider  Dr. Excell Seltzerooper      Visit Diagnosis: ST elevation myocardial infarction (STEMI), unspecified artery [facility](HCC)  Status post coronary artery stent placement  Patient's Home Medications on Admission:  Current Outpatient Prescriptions:  .  amLODipine (NORVASC) 5 MG tablet, Take 5 mg by mouth at bedtime., Disp: , Rfl: 0 .  aspirin 81 MG chewable tablet, Chew 81 mg by mouth daily., Disp: , Rfl:  .  atorvastatin (LIPITOR) 80 MG tablet, Take 1 tablet (80 mg total) by mouth daily at 6 PM., Disp: 30 tablet, Rfl: 6 .  glipiZIDE (GLUCOTROL) 10 MG tablet, Take 1 tablet by mouth 2 (two) times daily., Disp: , Rfl: 0 .  metFORMIN (GLUCOPHAGE) 500 MG tablet, Take 1,000 mg by mouth 2 (two) times daily. , Disp: , Rfl: 0 .  metoprolol tartrate (LOPRESSOR) 25 MG tablet, Take 1 tablet (25 mg total) by mouth 2 (two) times daily., Disp: 60 tablet, Rfl: 3 .  nitroGLYCERIN (NITROSTAT) 0.4 MG SL tablet, Place 1 tablet (0.4 mg total) under the tongue every 5 (five) minutes x 3 doses as needed for chest pain., Disp: 25 tablet, Rfl: 2 .  olmesartan-hydrochlorothiazide (BENICAR HCT) 40-12.5 MG tablet, Take 1 tablet by mouth daily., Disp: 30 tablet, Rfl: 11 .  ticagrelor (BRILINTA) 90 MG TABS tablet, Take 1 tablet (90 mg total) by mouth 2 (two) times daily., Disp: 60 tablet, Rfl: 10  Past Medical History: Past Medical History:  Diagnosis Date  . CAD (coronary artery disease), native coronary artery    11/28/16 STEMI PCI DES--LCx, EF normal.   . Diabetes mellitus without complication (HCC)   . Hypertension    . Multiple thyroid nodules    on CT of chest  . STEMI (ST elevation myocardial infarction) (HCC)     Tobacco Use: History  Smoking Status  . Never Smoker  Smokeless Tobacco  . Never Used    Labs: Recent Review Flowsheet Data    Labs for ITP Cardiac and Pulmonary Rehab Latest Ref Rng & Units 11/28/2016 11/29/2016   Cholestrol 0 - 200 mg/dL 409(W205(H) 119158   LDLCALC 0 - 99 mg/dL 147(W121(H) 94   HDL >29>40 mg/dL 48 41   Trlycerides <562<150 mg/dL 130(Q181(H) 657116   Hemoglobin A1c 4.8 - 5.6 % 8.8(H) -      Capillary Blood Glucose: Lab Results  Component Value Date   GLUCAP 204 (H) 11/30/2016   GLUCAP 143 (H) 11/30/2016   GLUCAP 165 (H) 11/29/2016   GLUCAP 224 (H) 11/29/2016   GLUCAP 204 (H) 11/29/2016     Exercise Target Goals: Date: 12/29/16  Exercise Program Goal: Individual exercise prescription set with THRR, safety & activity barriers. Participant demonstrates ability to understand and report RPE using BORG scale, to self-measure pulse accurately, and to acknowledge the importance of the exercise prescription.  Exercise Prescription Goal: Starting with aerobic activity 30 plus minutes a day, 3 days per week for initial exercise prescription. Provide home exercise prescription and guidelines that participant acknowledges understanding prior to discharge.  Activity Barriers & Risk Stratification:     Activity Barriers & Cardiac  Risk Stratification - 12/29/16 1706      Activity Barriers & Cardiac Risk Stratification   Activity Barriers None   Cardiac Risk Stratification High      6 Minute Walk:     6 Minute Walk    Row Name 12/29/16 1552         6 Minute Walk   Phase Initial     Distance 1500 feet     Distance % Change 0 %     Walk Time 6 minutes     # of Rest Breaks 0     MPH 2.84     METS 3.17     RPE 11     Perceived Dyspnea  13     VO2 Peak 10.57     Symptoms No     Resting HR 77 bpm     Resting BP 160/100     Max Ex. HR 118 bpm     Max Ex. BP 120/80     2  Minute Post BP 126/82        Oxygen Initial Assessment:   Oxygen Re-Evaluation:   Oxygen Discharge (Final Oxygen Re-Evaluation):   Initial Exercise Prescription:     Initial Exercise Prescription - 12/29/16 1500      Date of Initial Exercise RX and Referring Provider   Date 12/29/16   Referring Provider Dr. Excell Seltzer     Treadmill   MPH 1.5   Grade 0   Minutes 15   METs 2.1     NuStep   Level 2   SPM 13   Minutes 20   METs 1.8     Prescription Details   Frequency (times per week) 3   Duration Progress to 30 minutes of continuous aerobic without signs/symptoms of physical distress     Intensity   THRR 40-80% of Max Heartrate [phone]   Ratings of Perceived Exertion 11-13   Perceived Dyspnea 0-4     Progression   Progression Continue progressive overload as per policy without signs/symptoms or physical distress.     Resistance Training   Training Prescription Yes   Weight 1   Reps 10-15      Perform Capillary Blood Glucose checks as needed.  Exercise Prescription Changes:   Exercise Comments:   Exercise Goals and Review:     Exercise Goals    Row Name 12/29/16 1659             Exercise Goals   Increase Physical Activity Yes       Intervention Provide advice, education, support and counseling about physical activity/exercise needs.;Develop an individualized exercise prescription for aerobic and resistive training based on initial evaluation findings, risk stratification, comorbidities and participant's personal goals.       Expected Outcomes Achievement of increased cardiorespiratory fitness and enhanced flexibility, muscular endurance and strength shown through measurements of functional capacity and personal statement of participant.       Increase Strength and Stamina Yes       Intervention Provide advice, education, support and counseling about physical activity/exercise needs.;Develop an individualized exercise prescription for aerobic and  resistive training based on initial evaluation findings, risk stratification, comorbidities and participant's personal goals.       Expected Outcomes Achievement of increased cardiorespiratory fitness and enhanced flexibility, muscular endurance and strength shown through measurements of functional capacity and personal statement of participant.          Exercise Goals Re-Evaluation :    Discharge Exercise Prescription (Final Exercise  Prescription Changes):   Nutrition:  Target Goals: Understanding of nutrition guidelines, daily intake of sodium 1500mg , cholesterol 200mg , calories 30% from fat and 7% or less from saturated fats, daily to have 5 or more servings of fruits and vegetables.  Biometrics:     Pre Biometrics - 12/29/16 1501      Pre Biometrics   Height 5\' 6"  (1.676 m)   Waist Circumference 46.5 inches   Hip Circumference 49.5 inches   Waist to Hip Ratio 0.94 %   BMI (Calculated) 43.4   Triceps Skinfold 27 mm   % Body Fat 50.1 %   Grip Strength 49.8 kg   Flexibility 0 in   Single Leg Stand 2 seconds       Nutrition Therapy Plan and Nutrition Goals:   Nutrition Discharge: Rate Your Plate Scores:     Nutrition Assessments - 12/29/16 1706      MEDFICTS Scores   Pre Score 27      Nutrition Goals Re-Evaluation:   Nutrition Goals Discharge (Final Nutrition Goals Re-Evaluation):   Psychosocial: Target Goals: Acknowledge presence or absence of significant depression and/or stress, maximize coping skills, provide positive support system. Participant is able to verbalize types and ability to use techniques and skills needed for reducing stress and depression.  Initial Review & Psychosocial Screening:     Initial Psych Review & Screening - 12/29/16 1707      Initial Review   Current issues with None Identified     Family Dynamics   Good Support System? Yes     Barriers   Psychosocial barriers to participate in program There are no identifiable  barriers or psychosocial needs.     Screening Interventions   Interventions Encouraged to exercise      Quality of Life Scores:     Quality of Life - 12/29/16 1506      Quality of Life Scores   Health/Function Pre 15.6 %   Socioeconomic Pre 24.19 %   Psych/Spiritual Pre 28.29 %   Family Pre 21.6 %   GLOBAL Pre 20.96 %      PHQ-9: Recent Review Flowsheet Data    Depression screen [facility] 2/9 12/29/2016   Decreased Interest 0   Down, Depressed, Hopeless 0   PHQ - 2 Score 0   Altered sleeping 2   Tired, decreased energy 2   Change in appetite 0   Feeling bad or failure about yourself  0   Trouble concentrating 0   Moving slowly or fidgety/restless 0   Suicidal thoughts 0   PHQ-9 Score 4   Difficult doing work/chores Not difficult at all     Interpretation of Total Score  Total Score Depression Severity:  1-4 = Minimal depression, 5-9 = Mild depression, 10-14 = Moderate depression, 15-19 = Moderately severe depression, 20-27 = Severe depression   Psychosocial Evaluation and Intervention:     Psychosocial Evaluation - 12/29/16 1707      Psychosocial Evaluation & Interventions   Interventions Encouraged to exercise with the program and follow exercise prescription   Continue Psychosocial Services  No Follow up required      Psychosocial Re-Evaluation:   Psychosocial Discharge (Final Psychosocial Re-Evaluation):   Vocational Rehabilitation: Provide vocational rehab assistance to qualifying candidates.   Vocational Rehab Evaluation & Intervention:     Vocational Rehab - 12/29/16 1655      Initial Vocational Rehab Evaluation & Intervention   Assessment shows need for Vocational Rehabilitation No      Education: Education  Goals: Education classes will be provided on a weekly basis, covering required topics. Participant will state understanding/return demonstration of topics presented.  Learning Barriers/Preferences:     Learning Barriers/Preferences -  12/29/16 1654      Learning Barriers/Preferences   Learning Barriers Hearing   Learning Preferences Written Material;Computer/Internet      Education Topics: Hypertension, Hypertension Reduction -Define heart disease and high blood pressure. Discus how high blood pressure affects the body and ways to reduce high blood pressure.   Exercise and Your Heart -Discuss why it is important to exercise, the FITT principles of exercise, normal and abnormal responses to exercise, and how to exercise safely.   Angina -Discuss definition of angina, causes of angina, treatment of angina, and how to decrease risk of having angina.   Cardiac Medications -Review what the following cardiac medications are used for, how they affect the body, and side effects that may occur when taking the medications.  Medications include Aspirin, Beta blockers, calcium channel blockers, ACE Inhibitors, angiotensin receptor blockers, diuretics, digoxin, and antihyperlipidemics.   Congestive Heart Failure -Discuss the definition of CHF, how to live with CHF, the signs and symptoms of CHF, and how keep track of weight and sodium intake.   Heart Disease and Intimacy -Discus the effect sexual activity has on the heart, how changes occur during intimacy as we age, and safety during sexual activity.   Smoking Cessation / COPD -Discuss different methods to quit smoking, the health benefits of quitting smoking, and the definition of COPD.   Nutrition I: Fats -Discuss the types of cholesterol, what cholesterol does to the heart, and how cholesterol levels can be controlled.   Nutrition II: Labels -Discuss the different components of food labels and how to read food label   Heart Parts and Heart Disease -Discuss the anatomy of the heart, the pathway of blood circulation through the heart, and these are affected by heart disease.   Stress I: Signs and Symptoms -Discuss the causes of stress, how stress may lead to  anxiety and depression, and ways to limit stress.   Stress II: Relaxation -Discuss different types of relaxation techniques to limit stress.   Warning Signs of Stroke / TIA -Discuss definition of a stroke, what the signs and symptoms are of a stroke, and how to identify when someone is having stroke.   Knowledge Questionnaire Score:     Knowledge Questionnaire Score - 12/29/16 1654      Knowledge Questionnaire Score   Pre Score 24/24      Core Components/Risk Factors/Patient Goals at Admission:     Personal Goals and Risk Factors at Admission - 12/29/16 1659      Core Components/Risk Factors/Patient Goals on Admission    Weight Management Yes   Intervention Weight Management/Obesity: Establish reasonable short term and long term weight goals.   Admit Weight 268 lb 9.6 oz (121.8 kg)   Goal Weight: Short Term 258 lb 9.6 oz (117.3 kg)   Goal Weight: Long Term 248 lb 9.6 oz (112.8 kg)   Expected Outcomes Short Term: Continue to assess and modify interventions until short term weight is achieved;Long Term: Adherence to nutrition and physical activity/exercise program aimed toward attainment of established weight goal   Personal Goal Other Yes   Personal Goal Lose 20lbs while in program and continue losing after the program to reach 50lbs or more. Gain strength and endurance.    Intervention Attend CR 3 x week and supplement with home exercise 2 x week.  Expected Outcomes Reach personal goals.       Core Components/Risk Factors/Patient Goals Review:      Goals and Risk Factor Review    Row Name 12/29/16 1706             Core Components/Risk Factors/Patient Goals Review   Personal Goals Review Weight Management/Obesity          Core Components/Risk Factors/Patient Goals at Discharge (Final Review):      Goals and Risk Factor Review - 12/29/16 1706      Core Components/Risk Factors/Patient Goals Review   Personal Goals Review Weight Management/Obesity       ITP Comments:     ITP Comments    Row Name 12/29/16 1656 12/30/16 0733         ITP Comments Mrs. Mcintire is a 61 year old female that is starting our program. She has a hearing impediment. It will not keep her from exercising. she is in the process of getting hearing aides.  Patient new to program. Plans to start Monday 01/04/17.         Comments: ITP 30 Day REVIEW Patient new to program. Plans to start Monday 01/04/17.

## 2017-01-04 ENCOUNTER — Encounter (HOSPITAL_COMMUNITY)
Admission: RE | Admit: 2017-01-04 | Discharge: 2017-01-04 | Disposition: A | Discharge status: Home / Self Care | Payer: 59 | Source: Ambulatory Visit | Attending: Cardiovascular Disease | Admitting: Cardiovascular Disease

## 2017-01-04 DIAGNOSIS — I213 ST elevation (STEMI) myocardial infarction of unspecified site: Secondary | ICD-10-CM

## 2017-01-04 NOTE — Progress Notes (Signed)
Daily Session Note  Patient Details  Name: Felicia Silva MRN: [id number] Date of Birth: Jun 20, 1956 Referring Provider:     CARDIAC REHAB PHASE II ORIENTATION from 12/29/2016 in [place]  Referring Provider  Dr. Burt Knack      Encounter Date: 01/04/2017  Check In:     Session Check In - 01/04/17 0832      Check-In   Location [facility]   Staff Present Aundra Dubin, RN, BSN;Zawadi Aplin Luther Parody, BS, EP, Exercise Physiologist   Supervising physician immediately available to respond to emergencies See telemetry face sheet for immediately available MD   Medication changes reported     No   Fall or balance concerns reported    No   Warm-up and Cool-down Performed as group-led instruction   Resistance Training Performed Yes   VAD Patient? No     Pain Assessment   Currently in Pain? No/denies   Pain Score 0-No pain   Multiple Pain Sites No      Capillary Blood Glucose: No results found for this or any previous visit (from the past 24 hour(s)).    History  Smoking Status  . Never Smoker  Smokeless Tobacco  . Never Used    Goals Met:  Independence with exercise equipment Exercise tolerated well No report of cardiac concerns or symptoms Strength training completed today  Goals Unmet:  Not Applicable  Comments: Check out 915   Dr. Kate Sable is Medical Director for [place] and Pulmonary Rehab.

## 2017-01-06 ENCOUNTER — Encounter (HOSPITAL_COMMUNITY)
Admission: RE | Admit: 2017-01-06 | Discharge: 2017-01-06 | Disposition: A | Discharge status: Home / Self Care | Payer: 59 | Source: Ambulatory Visit | Attending: Cardiovascular Disease | Admitting: Cardiovascular Disease

## 2017-01-06 DIAGNOSIS — I213 ST elevation (STEMI) myocardial infarction of unspecified site: Secondary | ICD-10-CM | POA: Diagnosis not present

## 2017-01-06 NOTE — Progress Notes (Signed)
Daily Session Note  Patient Details  Name: Felicia Silva MRN: [id number] Date of Birth: 05-06-1956 Referring Provider:     CARDIAC REHAB PHASE II ORIENTATION from 12/29/2016 in [place]  Referring Provider  Dr. Burt Knack      Encounter Date: 01/06/2017  Check In:     Session Check In - 01/06/17 0827      Check-In   Location [facility]   Staff Present Aundra Dubin, RN, BSN;Bryssa Tones Luther Parody, BS, EP, Exercise Physiologist   Supervising physician immediately available to respond to emergencies See telemetry face sheet for immediately available MD   Medication changes reported     No   Fall or balance concerns reported    No   Warm-up and Cool-down Performed as group-led instruction   Resistance Training Performed Yes   VAD Patient? No     Pain Assessment   Currently in Pain? No/denies   Pain Score 0-No pain   Multiple Pain Sites No      Capillary Blood Glucose: No results found for this or any previous visit (from the past 24 hour(s)).    History  Smoking Status  . Never Smoker  Smokeless Tobacco  . Never Used    Goals Met:  Independence with exercise equipment Exercise tolerated well No report of cardiac concerns or symptoms Strength training completed today  Goals Unmet:  Not Applicable  Comments: Check out 915   Dr. Kate Sable is Medical Director for [place] and Pulmonary Rehab.

## 2017-01-08 ENCOUNTER — Encounter (HOSPITAL_COMMUNITY)
Admission: RE | Admit: 2017-01-08 | Discharge: 2017-01-08 | Disposition: A | Discharge status: Home / Self Care | Payer: 59 | Source: Ambulatory Visit | Attending: Cardiovascular Disease | Admitting: Cardiovascular Disease

## 2017-01-08 DIAGNOSIS — I213 ST elevation (STEMI) myocardial infarction of unspecified site: Secondary | ICD-10-CM | POA: Diagnosis not present

## 2017-01-08 NOTE — Progress Notes (Signed)
Daily Session Note  Patient Details  Name: ANABELA CRAYTON MRN: [id number] Date of Birth: 1956/06/12 Referring Provider:     CARDIAC REHAB PHASE II ORIENTATION from 12/29/2016 in [place]  Referring Provider  Dr. Burt Knack      Encounter Date: 01/08/2017  Check In:     Session Check In - 01/08/17 0814      Check-In   Location [facility]   Staff Present Aundra Dubin, RN, BSN;Winfred Redel Luther Parody, BS, EP, Exercise Physiologist   Supervising physician immediately available to respond to emergencies See telemetry face sheet for immediately available MD   Medication changes reported     No   Fall or balance concerns reported    No   Warm-up and Cool-down Performed as group-led instruction   Resistance Training Performed Yes   VAD Patient? No     Pain Assessment   Currently in Pain? No/denies   Pain Score 0-No pain   Multiple Pain Sites No      Capillary Blood Glucose: No results found for this or any previous visit (from the past 24 hour(s)).    History  Smoking Status  . Never Smoker  Smokeless Tobacco  . Never Used    Goals Met:  Independence with exercise equipment Exercise tolerated well No report of cardiac concerns or symptoms Strength training completed today  Goals Unmet:  Not Applicable  Comments: Check out 915   Dr. Kate Sable is Medical Director for [place] and Pulmonary Rehab.

## 2017-01-11 ENCOUNTER — Encounter (HOSPITAL_COMMUNITY)
Admission: RE | Admit: 2017-01-11 | Discharge: 2017-01-11 | Disposition: A | Discharge status: Home / Self Care | Payer: BLUE CROSS/BLUE SHIELD | Source: Ambulatory Visit | Attending: Cardiovascular Disease | Admitting: Cardiovascular Disease

## 2017-01-11 ENCOUNTER — Telehealth: Payer: Self-pay | Admitting: Cardiovascular Disease

## 2017-01-11 DIAGNOSIS — Z955 Presence of coronary angioplasty implant and graft: Secondary | ICD-10-CM | POA: Insufficient documentation

## 2017-01-11 DIAGNOSIS — I213 ST elevation (STEMI) myocardial infarction of unspecified site: Secondary | ICD-10-CM | POA: Insufficient documentation

## 2017-01-11 NOTE — Progress Notes (Signed)
Daily Session Note  Patient Details  Name: ODESSIE POLZIN MRN: [id number] Date of Birth: 1955-12-12 Referring Provider:     CARDIAC REHAB PHASE II ORIENTATION from 12/29/2016 in [place]  Referring Provider  Dr. Burt Knack      Encounter Date: 01/11/2017  Check In:     Session Check In - 01/11/17 0815      Check-In   Location [facility]   Staff Present Russella Dar, MS, EP, [facility], Exercise Physiologist;Gregory Luther Parody, BS, EP, Exercise Physiologist   Supervising physician immediately available to respond to emergencies See telemetry face sheet for immediately available MD   Medication changes reported     No   Fall or balance concerns reported    No   Warm-up and Cool-down Performed as group-led instruction   Resistance Training Performed Yes   VAD Patient? No     Pain Assessment   Currently in Pain? No/denies   Pain Score 0-No pain   Multiple Pain Sites No      Capillary Blood Glucose: No results found for this or any previous visit (from the past 24 hour(s)).    History  Smoking Status  . Never Smoker  Smokeless Tobacco  . Never Used    Goals Met:  Independence with exercise equipment  Goals Unmet:  Not Applicable  Comments: Check out: 9:15   Dr. Kate Sable is Medical Director for [facility] and Pulmonary Rehab.

## 2017-01-11 NOTE — Telephone Encounter (Signed)
Patient dropped off RTW Assessment Form for Dr.Cooper to complete. Placed in [employer] doc box.

## 2017-01-13 ENCOUNTER — Encounter (HOSPITAL_COMMUNITY): Payer: BLUE CROSS/BLUE SHIELD

## 2017-01-15 ENCOUNTER — Encounter (HOSPITAL_COMMUNITY)
Admission: RE | Admit: 2017-01-15 | Discharge: 2017-01-15 | Disposition: A | Discharge status: Home / Self Care | Payer: BLUE CROSS/BLUE SHIELD | Source: Ambulatory Visit | Attending: Cardiovascular Disease | Admitting: Cardiovascular Disease

## 2017-01-18 ENCOUNTER — Encounter (HOSPITAL_COMMUNITY)
Admission: RE | Admit: 2017-01-18 | Discharge: 2017-01-18 | Disposition: A | Discharge status: Home / Self Care | Payer: BLUE CROSS/BLUE SHIELD | Source: Ambulatory Visit | Attending: Cardiovascular Disease | Admitting: Cardiovascular Disease

## 2017-01-18 DIAGNOSIS — I213 ST elevation (STEMI) myocardial infarction of unspecified site: Secondary | ICD-10-CM

## 2017-01-18 DIAGNOSIS — Z955 Presence of coronary angioplasty implant and graft: Secondary | ICD-10-CM

## 2017-01-18 NOTE — Progress Notes (Signed)
Cardiac Individual Treatment Plan  Patient Details  Name: Felicia Silva MRN: [id number] Date of Birth: 1955-12-24 Referring Provider:     CARD[facility]  Referring Provider  Dr. Excell Seltzerooper      Initial Encounter Date:    CARDIAC REHAB PHASE II [facility]  Date  12/29/16  Referring Provider  Dr. Excell Seltzerooper      Visit Diagnosis: ST elevation myocardial infarction (STEMI), unspecified artery [facility](HCC)  Status post coronary artery stent placement  Patient's Home Medications on Admission:  Current Outpatient Prescriptions:  .  amLODipine (NORVASC) 5 MG tablet, Take 5 mg by mouth at bedtime., Disp: , Rfl: 0 .  aspirin 81 MG chewable tablet, Chew 81 mg by mouth daily., Disp: , Rfl:  .  atorvastatin (LIPITOR) 80 MG tablet, Take 1 tablet (80 mg total) by mouth daily at 6 PM., Disp: 30 tablet, Rfl: 6 .  glipiZIDE (GLUCOTROL) 10 MG tablet, Take 1 tablet by mouth 2 (two) times daily., Disp: , Rfl: 0 .  metFORMIN (GLUCOPHAGE) 500 MG tablet, Take 1,000 mg by mouth 2 (two) times daily. , Disp: , Rfl: 0 .  metoprolol tartrate (LOPRESSOR) 25 MG tablet, Take 1 tablet (25 mg total) by mouth 2 (two) times daily., Disp: 60 tablet, Rfl: 3 .  nitroGLYCERIN (NITROSTAT) 0.4 MG SL tablet, Place 1 tablet (0.4 mg total) under the tongue every 5 (five) minutes x 3 doses as needed for chest pain., Disp: 25 tablet, Rfl: 2 .  olmesartan-hydrochlorothiazide (BENICAR HCT) 40-12.5 MG tablet, Take 1 tablet by mouth daily., Disp: 30 tablet, Rfl: 11 .  ticagrelor (BRILINTA) 90 MG TABS tablet, Take 1 tablet (90 mg total) by mouth 2 (two) times daily., Disp: 60 tablet, Rfl: 10  Past Medical History: Past Medical History:  Diagnosis Date  . CAD (coronary artery disease), native coronary artery    11/28/16 STEMI PCI DES--LCx, EF normal.   . Diabetes mellitus without complication (HCC)   . Hypertension    . Multiple thyroid nodules    on CT of chest  . STEMI (ST elevation myocardial infarction) (HCC)     Tobacco Use: History  Smoking Status  . Never Smoker  Smokeless Tobacco  . Never Used    Labs: Recent Review Flowsheet Data    Labs for ITP Cardiac and Pulmonary Rehab Latest Ref Rng & Units 11/28/2016 11/29/2016   Cholestrol 0 - 200 mg/dL 409(W205(H) 119158   LDLCALC 0 - 99 mg/dL 147(W121(H) 94   HDL >29>40 mg/dL 48 41   Trlycerides <562<150 mg/dL 130(Q181(H) 657116   Hemoglobin A1c 4.8 - 5.6 % 8.8(H) -      Capillary Blood Glucose: Lab Results  Component Value Date   GLUCAP 204 (H) 11/30/2016   GLUCAP 143 (H) 11/30/2016   GLUCAP 165 (H) 11/29/2016   GLUCAP 224 (H) 11/29/2016   GLUCAP 204 (H) 11/29/2016     Exercise Target Goals:    Exercise Program Goal: Individual exercise prescription set with THRR, safety & activity barriers. Participant demonstrates ability to understand and report RPE using BORG scale, to self-measure pulse accurately, and to acknowledge the importance of the exercise prescription.  Exercise Prescription Goal: Starting with aerobic activity 30 plus minutes a day, 3 days per week for initial exercise prescription. Provide home exercise prescription and guidelines that participant acknowledges understanding prior to discharge.  Activity Barriers & Risk Stratification:     Activity Barriers & Cardiac  Risk Stratification - 12/29/16 1706      Activity Barriers & Cardiac Risk Stratification   Activity Barriers None   Cardiac Risk Stratification High      6 Minute Walk:     6 Minute Walk    Row Name 12/29/16 1552         6 Minute Walk   Phase Initial     Distance 1500 feet     Distance % Change 0 %     Walk Time 6 minutes     # of Rest Breaks 0     MPH 2.84     METS 3.17     RPE 11     Perceived Dyspnea  13     VO2 Peak 10.57     Symptoms No     Resting HR 77 bpm     Resting BP 160/100     Max Ex. HR 118 bpm     Max Ex. BP 120/80     2 Minute Post BP  126/82        Oxygen Initial Assessment:   Oxygen Re-Evaluation:   Oxygen Discharge (Final Oxygen Re-Evaluation):   Initial Exercise Prescription:     Initial Exercise Prescription - 12/29/16 1500      Date of Initial Exercise RX and Referring Provider   Date 12/29/16   Referring Provider Dr. Excell Seltzer     Treadmill   MPH 1.5   Grade 0   Minutes 15   METs 2.1     NuStep   Level 2   SPM 13   Minutes 20   METs 1.8     Prescription Details   Frequency (times per week) 3   Duration Progress to 30 minutes of continuous aerobic without signs/symptoms of physical distress     Intensity   THRR 40-80% of Max Heartrate [phone]   Ratings of Perceived Exertion 11-13   Perceived Dyspnea 0-4     Progression   Progression Continue progressive overload as per policy without signs/symptoms or physical distress.     Resistance Training   Training Prescription Yes   Weight 1   Reps 10-15      Perform Capillary Blood Glucose checks as needed.  Exercise Prescription Changes:      Exercise Prescription Changes    Row Name 01/12/17 0800             Response to Exercise   Blood Pressure (Admit) 130/64       Blood Pressure (Exercise) 120/70       Blood Pressure (Exit) 130/80       Heart Rate (Admit) 55 bpm       Heart Rate (Exercise) 109 bpm       Heart Rate (Exit) 66 bpm       Rating of Perceived Exertion (Exercise) 11       Duration Progress to 30 minutes of  aerobic without signs/symptoms of physical distress       Intensity THRR unchanged         Progression   Progression Continue to progress workloads to maintain intensity without signs/symptoms of physical distress.         Resistance Training   Training Prescription Yes       Weight 1       Reps 10-15         Treadmill   MPH 2       Grade 0       Minutes 15  METs 2.5         NuStep   Level 3       SPM 33       Minutes 20       METs 3.75         Home Exercise Plan   Plans to  continue exercise at Home (comment)       Frequency Add 2 additional days to program exercise sessions.          Exercise Comments:      Exercise Comments    Row Name 01/12/17 [phone]           Exercise Comments Patient is doing well in CR.           Exercise Goals and Review:      Exercise Goals    Row Name 12/29/16 1659             Exercise Goals   Increase Physical Activity Yes       Intervention Provide advice, education, support and counseling about physical activity/exercise needs.;Develop an individualized exercise prescription for aerobic and resistive training based on initial evaluation findings, risk stratification, comorbidities and participant's personal goals.       Expected Outcomes Achievement of increased cardiorespiratory fitness and enhanced flexibility, muscular endurance and strength shown through measurements of functional capacity and personal statement of participant.       Increase Strength and Stamina Yes       Intervention Provide advice, education, support and counseling about physical activity/exercise needs.;Develop an individualized exercise prescription for aerobic and resistive training based on initial evaluation findings, risk stratification, comorbidities and participant's personal goals.       Expected Outcomes Achievement of increased cardiorespiratory fitness and enhanced flexibility, muscular endurance and strength shown through measurements of functional capacity and personal statement of participant.          Exercise Goals Re-Evaluation :    Discharge Exercise Prescription (Final Exercise Prescription Changes):     Exercise Prescription Changes - 01/12/17 0800      Response to Exercise   Blood Pressure (Admit) 130/64   Blood Pressure (Exercise) 120/70   Blood Pressure (Exit) 130/80   Heart Rate (Admit) 55 bpm   Heart Rate (Exercise) 109 bpm   Heart Rate (Exit) 66 bpm   Rating of Perceived Exertion (Exercise) 11    Duration Progress to 30 minutes of  aerobic without signs/symptoms of physical distress   Intensity THRR unchanged     Progression   Progression Continue to progress workloads to maintain intensity without signs/symptoms of physical distress.     Resistance Training   Training Prescription Yes   Weight 1   Reps 10-15     Treadmill   MPH 2   Grade 0   Minutes 15   METs 2.5     NuStep   Level 3   SPM 33   Minutes 20   METs 3.75     Home Exercise Plan   Plans to continue exercise at Home (comment)   Frequency Add 2 additional days to program exercise sessions.      Nutrition:  Target Goals: Understanding of nutrition guidelines, daily intake of sodium 1500mg , cholesterol 200mg , calories 30% from fat and 7% or less from saturated fats, daily to have 5 or more servings of fruits and vegetables.  Biometrics:     Pre Biometrics - 12/29/16 1501      Pre Biometrics   Height 5\' 6"  (1.676 m)  Waist Circumference 46.5 inches   Hip Circumference 49.5 inches   Waist to Hip Ratio 0.94 %   BMI (Calculated) 43.4   Triceps Skinfold 27 mm   % Body Fat 50.1 %   Grip Strength 49.8 kg   Flexibility 0 in   Single Leg Stand 2 seconds       Nutrition Therapy Plan and Nutrition Goals:   Nutrition Discharge: Rate Your Plate Scores:     Nutrition Assessments - 12/29/16 1706      MEDFICTS Scores   Pre Score 27      Nutrition Goals Re-Evaluation:   Nutrition Goals Discharge (Final Nutrition Goals Re-Evaluation):   Psychosocial: Target Goals: Acknowledge presence or absence of significant depression and/or stress, maximize coping skills, provide positive support system. Participant is able to verbalize types and ability to use techniques and skills needed for reducing stress and depression.  Initial Review & Psychosocial Screening:     Initial Psych Review & Screening - 12/29/16 1707      Initial Review   Current issues with None Identified     Family Dynamics    Good Support System? Yes     Barriers   Psychosocial barriers to participate in program There are no identifiable barriers or psychosocial needs.     Screening Interventions   Interventions Encouraged to exercise      Quality of Life Scores:     Quality of Life - 12/29/16 1506      Quality of Life Scores   Health/Function Pre 15.6 %   Socioeconomic Pre 24.19 %   Psych/Spiritual Pre 28.29 %   Family Pre 21.6 %   GLOBAL Pre 20.96 %      PHQ-9: Recent Review Flowsheet Data    Depression screen [facility] 2/9 12/29/2016   Decreased Interest 0   Down, Depressed, Hopeless 0   PHQ - 2 Score 0   Altered sleeping 2   Tired, decreased energy 2   Change in appetite 0   Feeling bad or failure about yourself  0   Trouble concentrating 0   Moving slowly or fidgety/restless 0   Suicidal thoughts 0   PHQ-9 Score 4   Difficult doing work/chores Not difficult at all     Interpretation of Total Score  Total Score Depression Severity:  1-4 = Minimal depression, 5-9 = Mild depression, 10-14 = Moderate depression, 15-19 = Moderately severe depression, 20-27 = Severe depression   Psychosocial Evaluation and Intervention:     Psychosocial Evaluation - 12/29/16 1707      Psychosocial Evaluation & Interventions   Interventions Encouraged to exercise with the program and follow exercise prescription   Continue Psychosocial Services  No Follow up required      Psychosocial Re-Evaluation:   Psychosocial Discharge (Final Psychosocial Re-Evaluation):   Vocational Rehabilitation: Provide vocational rehab assistance to qualifying candidates.   Vocational Rehab Evaluation & Intervention:     Vocational Rehab - 12/29/16 1655      Initial Vocational Rehab Evaluation & Intervention   Assessment shows need for Vocational Rehabilitation No      Education: Education Goals: Education classes will be provided on a weekly basis, covering required topics. Participant will state  understanding/return demonstration of topics presented.  Learning Barriers/Preferences:     Learning Barriers/Preferences - 12/29/16 1654      Learning Barriers/Preferences   Learning Barriers Hearing   Learning Preferences Written Material;Computer/Internet      Education Topics: Hypertension, Hypertension Reduction -Define heart disease and high blood  pressure. Discus how high blood pressure affects the body and ways to reduce high blood pressure.   Exercise and Your Heart -Discuss why it is important to exercise, the FITT principles of exercise, normal and abnormal responses to exercise, and how to exercise safely.   Angina -Discuss definition of angina, causes of angina, treatment of angina, and how to decrease risk of having angina.   Cardiac Medications -Review what the following cardiac medications are used for, how they affect the body, and side effects that may occur when taking the medications.  Medications include Aspirin, Beta blockers, calcium channel blockers, ACE Inhibitors, angiotensin receptor blockers, diuretics, digoxin, and antihyperlipidemics.   Congestive Heart Failure -Discuss the definition of CHF, how to live with CHF, the signs and symptoms of CHF, and how keep track of weight and sodium intake.   Heart Disease and Intimacy -Discus the effect sexual activity has on the heart, how changes occur during intimacy as we age, and safety during sexual activity.   Smoking Cessation / COPD -Discuss different methods to quit smoking, the health benefits of quitting smoking, and the definition of COPD.   Nutrition I: Fats -Discuss the types of cholesterol, what cholesterol does to the heart, and how cholesterol levels can be controlled.   Nutrition II: Labels -Discuss the different components of food labels and how to read food label   CARDIAC REHAB PHASE II EXERCISE from 01/06/2017 in [facility]  Date  01/06/17  Educator  DC   Instruction Review Code  2- meets goals/outcomes      Heart Parts and Heart Disease -Discuss the anatomy of the heart, the pathway of blood circulation through the heart, and these are affected by heart disease.   Stress I: Signs and Symptoms -Discuss the causes of stress, how stress may lead to anxiety and depression, and ways to limit stress.   Stress II: Relaxation -Discuss different types of relaxation techniques to limit stress.   Warning Signs of Stroke / TIA -Discuss definition of a stroke, what the signs and symptoms are of a stroke, and how to identify when someone is having stroke.   Knowledge Questionnaire Score:     Knowledge Questionnaire Score - 12/29/16 1654      Knowledge Questionnaire Score   Pre Score 24/24      Core Components/Risk Factors/Patient Goals at Admission:     Personal Goals and Risk Factors at Admission - 12/29/16 1659      Core Components/Risk Factors/Patient Goals on Admission    Weight Management Yes   Intervention Weight Management/Obesity: Establish reasonable short term and long term weight goals.   Admit Weight 268 lb 9.6 oz (121.8 kg)   Goal Weight: Short Term 258 lb 9.6 oz (117.3 kg)   Goal Weight: Long Term 248 lb 9.6 oz (112.8 kg)   Expected Outcomes Short Term: Continue to assess and modify interventions until short term weight is achieved;Long Term: Adherence to nutrition and physical activity/exercise program aimed toward attainment of established weight goal   Personal Goal Other Yes   Personal Goal Lose 20lbs while in program and continue losing after the program to reach 50lbs or more. Gain strength and endurance.    Intervention Attend CR 3 x week and supplement with home exercise 2 x week.    Expected Outcomes Reach personal goals.       Core Components/Risk Factors/Patient Goals Review:      Goals and Risk Factor Review    Row Name 12/29/16 [phone]  Core Components/Risk Factors/Patient Goals Review    Personal Goals Review Weight Management/Obesity          Core Components/Risk Factors/Patient Goals at Discharge (Final Review):      Goals and Risk Factor Review - 12/29/16 1706      Core Components/Risk Factors/Patient Goals Review   Personal Goals Review Weight Management/Obesity      ITP Comments:     ITP Comments    Row Name 12/29/16 1656 12/30/16 0733 01/18/17 0752       ITP Comments Mrs. Valenza is a 61 year old female that is starting our program. She has a hearing impediment. It will not keep her from exercising. she is in the process of getting hearing aides.  Patient new to program. Plans to start Monday 01/04/17. Patient new to program. She has completed 5 sessions. Will continue to monitor for progress.         Comments: ITP 30 Day REVIEW Patient new to program. She has completed 5 sessions. Will continue to monitor for progress.

## 2017-01-18 NOTE — Progress Notes (Signed)
Daily Session Note  Patient Details  Name: Felicia Silva MRN: [id number] Date of Birth: [birth year] Referring Provider:     CARDIAC REHAB PHASE II ORIENTATION from 12/29/2016 in [place]  Referring Provider  Dr. Burt Knack      Encounter Date: 01/18/2017  Check In:     Session Check In - 01/18/17 0824      Check-In   Location [facility]   Staff Present Aundra Dubin, RN, BSN;Courtney Bellizzi Luther Parody, BS, EP, Exercise Physiologist   Supervising physician immediately available to respond to emergencies See telemetry face sheet for immediately available MD   Medication changes reported     No   Fall or balance concerns reported    No   Warm-up and Cool-down Performed as group-led instruction   Resistance Training Performed Yes   VAD Patient? No     Pain Assessment   Currently in Pain? No/denies   Pain Score 0-No pain   Multiple Pain Sites No      Capillary Blood Glucose: No results found for this or any previous visit (from the past 24 hour(s)).    History  Smoking Status  . Never Smoker  Smokeless Tobacco  . Never Used    Goals Met:  Independence with exercise equipment Exercise tolerated well No report of cardiac concerns or symptoms Strength training completed today  Goals Unmet:  Not Applicable  Comments: Check out 915   Dr. Kate Sable is Medical Director for [place] and Pulmonary Rehab.

## 2017-01-20 ENCOUNTER — Encounter (HOSPITAL_COMMUNITY)
Admission: RE | Admit: 2017-01-20 | Discharge: 2017-01-20 | Disposition: A | Discharge status: Home / Self Care | Payer: BLUE CROSS/BLUE SHIELD | Source: Ambulatory Visit | Attending: Cardiovascular Disease | Admitting: Cardiovascular Disease

## 2017-01-20 DIAGNOSIS — I213 ST elevation (STEMI) myocardial infarction of unspecified site: Secondary | ICD-10-CM | POA: Diagnosis not present

## 2017-01-20 DIAGNOSIS — Z955 Presence of coronary angioplasty implant and graft: Secondary | ICD-10-CM | POA: Diagnosis not present

## 2017-01-20 NOTE — Progress Notes (Signed)
Daily Session Note  Patient Details  Name: KHELANI KOPS MRN: [id number] Date of Birth: 22-Nov-1955 Referring Provider:     CARDIAC REHAB PHASE II ORIENTATION from 12/29/2016 in [place]  Referring Provider  Dr. Burt Knack      Encounter Date: 01/20/2017  Check In:     Session Check In - 01/20/17 0819      Check-In   Location [facility]   Staff Present Aundra Dubin, RN, BSN;Montavius Subramaniam Luther Parody, BS, EP, Exercise Physiologist   Supervising physician immediately available to respond to emergencies See telemetry face sheet for immediately available MD   Medication changes reported     No   Fall or balance concerns reported    No   Warm-up and Cool-down Performed as group-led instruction   Resistance Training Performed Yes   VAD Patient? No     Pain Assessment   Currently in Pain? No/denies   Pain Score 0-No pain   Multiple Pain Sites No      Capillary Blood Glucose: No results found for this or any previous visit (from the past 24 hour(s)).    History  Smoking Status  . Never Smoker  Smokeless Tobacco  . Never Used    Goals Met:  Independence with exercise equipment Exercise tolerated well No report of cardiac concerns or symptoms Strength training completed today  Goals Unmet:  Not Applicable  Comments: Check out 915   Dr. Kate Sable is Medical Director for [place] and Pulmonary Rehab.

## 2017-01-21 ENCOUNTER — Other Ambulatory Visit: Payer: BLUE CROSS/BLUE SHIELD | Admitting: *Deleted

## 2017-01-21 DIAGNOSIS — I2121 ST elevation (STEMI) myocardial infarction involving left circumflex coronary artery: Secondary | ICD-10-CM

## 2017-01-21 DIAGNOSIS — E782 Mixed hyperlipidemia: Secondary | ICD-10-CM

## 2017-01-21 LAB — LIPID PANEL
CHOL/HDL RATIO: 2.9 ratio (ref 0.0–4.4)
Cholesterol, Total: 107 mg/dL (ref 100–199)
HDL: 37 mg/dL — ABNORMAL LOW (ref >39)
LDL Calculated: 45 mg/dL (ref 0–99)
TRIGLYCERIDES: 124 mg/dL (ref 0–149)
VLDL Cholesterol Cal: 25 mg/dL (ref 5–40)

## 2017-01-21 LAB — HEPATIC FUNCTION PANEL
ALT: 28 IU/L (ref 0–32)
AST: 20 IU/L (ref 0–40)
Albumin: 4.1 g/dL (ref 3.5–5.5)
Alkaline Phosphatase: 111 IU/L (ref 39–117)
Bilirubin Total: 0.6 mg/dL (ref 0.0–1.2)
Bilirubin, Direct: 0.16 mg/dL (ref 0.00–0.40)
Total Protein: 6.7 g/dL (ref 6.0–8.5)

## 2017-01-22 ENCOUNTER — Encounter (HOSPITAL_COMMUNITY)
Admission: RE | Admit: 2017-01-22 | Discharge: 2017-01-22 | Disposition: A | Discharge status: Home / Self Care | Payer: BLUE CROSS/BLUE SHIELD | Source: Ambulatory Visit | Attending: Cardiovascular Disease | Admitting: Cardiovascular Disease

## 2017-01-22 ENCOUNTER — Other Ambulatory Visit: Payer: Self-pay

## 2017-01-22 DIAGNOSIS — I213 ST elevation (STEMI) myocardial infarction of unspecified site: Secondary | ICD-10-CM | POA: Diagnosis not present

## 2017-01-22 DIAGNOSIS — Z955 Presence of coronary angioplasty implant and graft: Secondary | ICD-10-CM | POA: Diagnosis not present

## 2017-01-22 MED ORDER — OLMESARTAN MEDOXOMIL-HCTZ 40-12.5 MG PO TABS: 1.0000 | ORAL_TABLET | Freq: Every day | ORAL | 3 refills | Status: DC

## 2017-01-22 NOTE — Progress Notes (Signed)
Daily Session Note  Patient Details  Name: Felicia Silva MRN: [id number] Date of Birth: 10-16-1955 Referring Provider:     CARDIAC REHAB PHASE II ORIENTATION from 12/29/2016 in [place]  Referring Provider  Dr. Burt Knack      Encounter Date: 01/22/2017  Check In:     Session Check In - 01/22/17 0825      Check-In   Location [facility]   Staff Present Aundra Dubin, RN, BSN;Jniya Madara Luther Parody, BS, EP, Exercise Physiologist   Supervising physician immediately available to respond to emergencies See telemetry face sheet for immediately available MD   Medication changes reported     No   Fall or balance concerns reported    No   Warm-up and Cool-down Performed as group-led instruction   Resistance Training Performed Yes   VAD Patient? No     Pain Assessment   Currently in Pain? No/denies   Pain Score 0-No pain   Multiple Pain Sites No      Capillary Blood Glucose: Results for orders placed or performed in visit on 01/21/17 (from the past 24 hour(s))  Lipid panel     Status: Abnormal   Collection Time: 01/21/17 10:45 AM  Result Value Ref Range   Cholesterol, Total 107 100 - 199 mg/dL   Triglycerides 124 0 - 149 mg/dL   HDL 37 (L) >39 mg/dL   VLDL Cholesterol Cal 25 5 - 40 mg/dL   LDL Calculated 45 0 - 99 mg/dL   Chol/HDL Ratio 2.9 0.0 - 4.4 ratio   Narrative   Performed at:  [facility] [address] [place], [place]  [id number] Lab Director: Lindon Romp MD, Phone:  [phone]  Hepatic function panel     Status: None   Collection Time: 01/21/17 10:45 AM  Result Value Ref Range   Total Protein 6.7 6.0 - 8.5 g/dL   Albumin 4.1 3.5 - 5.5 g/dL   Bilirubin Total 0.6 0.0 - 1.2 mg/dL   Bilirubin, Direct 0.16 0.00 - 0.40 mg/dL   Alkaline Phosphatase 111 39 - 117 IU/L   AST 20 0 - 40 IU/L   ALT 28 0 - 32 IU/L   Narrative   Performed at:  [facility] [address], [place], [place]  [id number] Lab  Director: Lindon Romp MD, Phone:  [phone]      History  Smoking Status  . Never Smoker  Smokeless Tobacco  . Never Used    Goals Met:  Independence with exercise equipment Exercise tolerated well No report of cardiac concerns or symptoms Strength training completed today  Goals Unmet:  Not Applicable  Comments: Check out 915   Dr. Kate Sable is Medical Director for [place] and Pulmonary Rehab.

## 2017-01-25 ENCOUNTER — Encounter (HOSPITAL_COMMUNITY)
Admission: RE | Admit: 2017-01-25 | Discharge: 2017-01-25 | Disposition: A | Discharge status: Home / Self Care | Payer: BLUE CROSS/BLUE SHIELD | Source: Ambulatory Visit | Attending: Cardiovascular Disease | Admitting: Cardiovascular Disease

## 2017-01-25 ENCOUNTER — Telehealth: Payer: Self-pay | Admitting: *Deleted

## 2017-01-25 DIAGNOSIS — I213 ST elevation (STEMI) myocardial infarction of unspecified site: Secondary | ICD-10-CM | POA: Diagnosis not present

## 2017-01-25 DIAGNOSIS — Z955 Presence of coronary angioplasty implant and graft: Secondary | ICD-10-CM | POA: Diagnosis not present

## 2017-01-25 NOTE — Telephone Encounter (Signed)
Lmtcb  To go over lab results

## 2017-01-25 NOTE — Progress Notes (Signed)
Daily Session Note  Patient Details  Name: Felicia Silva MRN: [id number] Date of Birth: 1955-11-26 Referring Provider:     CARDIAC REHAB PHASE II ORIENTATION from 12/29/2016 in [place]  Referring Provider  Dr. Burt Knack      Encounter Date: 01/25/2017  Check In:     Session Check In - 01/25/17 0825      Check-In   Location [facility]   Staff Present Russella Dar, MS, EP, [facility], Exercise Physiologist;Dreyah Montrose Luther Parody, BS, EP, Exercise Physiologist   Supervising physician immediately available to respond to emergencies See telemetry face sheet for immediately available MD   Medication changes reported     No   Fall or balance concerns reported    No   Warm-up and Cool-down Performed as group-led instruction   Resistance Training Performed Yes   VAD Patient? No     Pain Assessment   Currently in Pain? No/denies   Pain Score 0-No pain   Multiple Pain Sites No      Capillary Blood Glucose: No results found for this or any previous visit (from the past 24 hour(s)).    History  Smoking Status  . Never Smoker  Smokeless Tobacco  . Never Used    Goals Met:  Independence with exercise equipment Exercise tolerated well No report of cardiac concerns or symptoms Strength training completed today  Goals Unmet:  Not Applicable  Comments: Check out 915   Dr. Kate Sable is Medical Director for [place] and Pulmonary Rehab.

## 2017-01-25 NOTE — Telephone Encounter (Signed)
-----   Message from HarperBhavinkumar Bhagat, [place] sent at 01/23/2017  3:31 PM EDT ----- Stable labs.

## 2017-01-26 ENCOUNTER — Telehealth: Payer: Self-pay

## 2017-01-26 DIAGNOSIS — H903 Sensorineural hearing loss, bilateral: Secondary | ICD-10-CM | POA: Diagnosis not present

## 2017-01-26 NOTE — Telephone Encounter (Signed)
I have done a [place] PA through covermymeds. Awaiting response.

## 2017-01-27 ENCOUNTER — Encounter (HOSPITAL_COMMUNITY)
Admission: RE | Admit: 2017-01-27 | Discharge: 2017-01-27 | Disposition: A | Discharge status: Home / Self Care | Payer: BLUE CROSS/BLUE SHIELD | Source: Ambulatory Visit | Attending: Cardiovascular Disease | Admitting: Cardiovascular Disease

## 2017-01-27 DIAGNOSIS — I213 ST elevation (STEMI) myocardial infarction of unspecified site: Secondary | ICD-10-CM | POA: Diagnosis not present

## 2017-01-27 DIAGNOSIS — Z955 Presence of coronary angioplasty implant and graft: Secondary | ICD-10-CM | POA: Diagnosis not present

## 2017-01-27 NOTE — Progress Notes (Signed)
Daily Session Note  Patient Details  Name: MARKEYA MINCY MRN: [id number] Date of Birth: 1956-06-06 Referring Provider:     CARDIAC REHAB PHASE II ORIENTATION from 12/29/2016 in [place]  Referring Provider  Dr. Burt Knack      Encounter Date: 01/27/2017  Check In:     Session Check In - 01/27/17 0812      Check-In   Location [facility]   Staff Present Russella Dar, MS, EP, [facility], Exercise Physiologist;Velmer Broadfoot Luther Parody, BS, EP, Exercise Physiologist   Supervising physician immediately available to respond to emergencies See telemetry face sheet for immediately available MD   Medication changes reported     No   Fall or balance concerns reported    No   Warm-up and Cool-down Performed as group-led instruction   Resistance Training Performed Yes   VAD Patient? No     Pain Assessment   Currently in Pain? No/denies   Pain Score 0-No pain   Multiple Pain Sites No      Capillary Blood Glucose: No results found for this or any previous visit (from the past 24 hour(s)).    History  Smoking Status  . Never Smoker  Smokeless Tobacco  . Never Used    Goals Met:  Independence with exercise equipment Exercise tolerated well No report of cardiac concerns or symptoms Strength training completed today  Goals Unmet:  Not Applicable  Comments: Check out 915   Dr. Kate Sable is Medical Director for [facility] and Pulmonary Rehab.

## 2017-01-29 ENCOUNTER — Encounter (HOSPITAL_COMMUNITY)
Admission: RE | Admit: 2017-01-29 | Discharge: 2017-01-29 | Disposition: A | Discharge status: Home / Self Care | Payer: BLUE CROSS/BLUE SHIELD | Source: Ambulatory Visit | Attending: Cardiovascular Disease | Admitting: Cardiovascular Disease

## 2017-01-29 ENCOUNTER — Encounter: Payer: Self-pay | Admitting: *Deleted

## 2017-01-29 DIAGNOSIS — I213 ST elevation (STEMI) myocardial infarction of unspecified site: Secondary | ICD-10-CM | POA: Diagnosis not present

## 2017-01-29 DIAGNOSIS — Z955 Presence of coronary angioplasty implant and graft: Secondary | ICD-10-CM | POA: Diagnosis not present

## 2017-01-29 NOTE — Progress Notes (Signed)
Daily Session Note  Patient Details  Name: Felicia Silva MRN: [id number] Date of Birth: 07-25-1955 Referring Provider:     CARDIAC REHAB PHASE II ORIENTATION from 12/29/2016 in [place]  Referring Provider  Dr. Burt Knack      Encounter Date: 01/29/2017  Check In:     Session Check In - 01/29/17 0830      Check-In   Location [facility]   Staff Present Russella Dar, MS, EP, [facility], Exercise Physiologist;Bland Rudzinski Luther Parody, BS, EP, Exercise Physiologist   Supervising physician immediately available to respond to emergencies See telemetry face sheet for immediately available MD   Medication changes reported     No   Fall or balance concerns reported    No   Warm-up and Cool-down Performed as group-led instruction   Resistance Training Performed Yes   VAD Patient? No     Pain Assessment   Currently in Pain? No/denies   Pain Score 0-No pain   Multiple Pain Sites No      Capillary Blood Glucose: No results found for this or any previous visit (from the past 24 hour(s)).    History  Smoking Status  . Never Smoker  Smokeless Tobacco  . Never Used    Goals Met:  Independence with exercise equipment Exercise tolerated well No report of cardiac concerns or symptoms Strength training completed today  Goals Unmet:  Not Applicable  Comments: Check out 915   Dr. Kate Sable is Medical Director for [place] and Pulmonary Rehab.

## 2017-02-01 ENCOUNTER — Encounter (HOSPITAL_COMMUNITY)
Admission: RE | Admit: 2017-02-01 | Discharge: 2017-02-01 | Disposition: A | Discharge status: Home / Self Care | Payer: BLUE CROSS/BLUE SHIELD | Source: Ambulatory Visit | Attending: Cardiovascular Disease | Admitting: Cardiovascular Disease

## 2017-02-01 DIAGNOSIS — I213 ST elevation (STEMI) myocardial infarction of unspecified site: Secondary | ICD-10-CM | POA: Diagnosis not present

## 2017-02-01 DIAGNOSIS — Z955 Presence of coronary angioplasty implant and graft: Secondary | ICD-10-CM | POA: Diagnosis not present

## 2017-02-03 ENCOUNTER — Encounter (HOSPITAL_COMMUNITY)
Admission: RE | Admit: 2017-02-03 | Discharge: 2017-02-03 | Disposition: A | Discharge status: Home / Self Care | Payer: BLUE CROSS/BLUE SHIELD | Source: Ambulatory Visit | Attending: Cardiovascular Disease | Admitting: Cardiovascular Disease

## 2017-02-03 DIAGNOSIS — I213 ST elevation (STEMI) myocardial infarction of unspecified site: Secondary | ICD-10-CM | POA: Diagnosis not present

## 2017-02-03 DIAGNOSIS — Z955 Presence of coronary angioplasty implant and graft: Secondary | ICD-10-CM | POA: Diagnosis not present

## 2017-02-03 NOTE — Progress Notes (Signed)
Daily Session Note  Patient Details  Name: Felicia Silva MRN: [id number] Date of Birth: 10-Dec-1955 Referring Provider:     CARDIAC REHAB PHASE II ORIENTATION from 12/29/2016 in [place]  Referring Provider  Dr. Burt Knack      Encounter Date: 02/03/2017  Check In:     Session Check In - 02/03/17 0815      Check-In   Location [facility]   Staff Present Russella Dar, MS, EP, [facility], Exercise Physiologist;Gregory Luther Parody, BS, EP, Exercise Physiologist   Supervising physician immediately available to respond to emergencies See telemetry face sheet for immediately available MD   Medication changes reported     No   Fall or balance concerns reported    No   Tobacco Cessation No Change   Resistance Training Performed Yes   VAD Patient? No     Pain Assessment   Currently in Pain? No/denies   Pain Score 0-No pain   Multiple Pain Sites No      Capillary Blood Glucose: No results found for this or any previous visit (from the past 24 hour(s)).    History  Smoking Status  . Never Smoker  Smokeless Tobacco  . Never Used    Goals Met:  Independence with exercise equipment Exercise tolerated well No report of cardiac concerns or symptoms Strength training completed today  Goals Unmet:  Not Applicable  Comments: Check out: 9:15   Dr. Kate Sable is Medical Director for [place] and Pulmonary Rehab.

## 2017-02-05 ENCOUNTER — Encounter (HOSPITAL_COMMUNITY)
Admission: RE | Admit: 2017-02-05 | Discharge: 2017-02-05 | Disposition: A | Discharge status: Home / Self Care | Payer: BLUE CROSS/BLUE SHIELD | Source: Ambulatory Visit | Attending: Cardiovascular Disease | Admitting: Cardiovascular Disease

## 2017-02-05 DIAGNOSIS — Z955 Presence of coronary angioplasty implant and graft: Secondary | ICD-10-CM | POA: Diagnosis not present

## 2017-02-05 DIAGNOSIS — I213 ST elevation (STEMI) myocardial infarction of unspecified site: Secondary | ICD-10-CM

## 2017-02-05 NOTE — Progress Notes (Signed)
Daily Session Note  Patient Details  Name: Felicia Silva MRN: [id number] Date of Birth: 1956/05/30 Referring Provider:     CARDIAC REHAB PHASE II ORIENTATION from 12/29/2016 in [place]  Referring Provider  Dr. Burt Knack      Encounter Date: 02/05/2017  Check In:     Session Check In - 02/05/17 0824      Check-In   Location [facility]   Staff Present Russella Dar, MS, EP, [facility], Exercise Physiologist;Marque Bango Luther Parody, BS, EP, Exercise Physiologist   Supervising physician immediately available to respond to emergencies See telemetry face sheet for immediately available MD   Medication changes reported     No   Fall or balance concerns reported    No   Warm-up and Cool-down Performed as group-led instruction   Resistance Training Performed Yes   VAD Patient? No     Pain Assessment   Currently in Pain? No/denies   Pain Score 0-No pain   Multiple Pain Sites No      Capillary Blood Glucose: No results found for this or any previous visit (from the past 24 hour(s)).    History  Smoking Status  . Never Smoker  Smokeless Tobacco  . Never Used    Goals Met:  Independence with exercise equipment Exercise tolerated well No report of cardiac concerns or symptoms Strength training completed today  Goals Unmet:  Not Applicable  Comments: Check out 915   Dr. Kate Sable is Medical Director for [place] and Pulmonary Rehab.

## 2017-02-08 ENCOUNTER — Encounter (HOSPITAL_COMMUNITY)
Admission: RE | Admit: 2017-02-08 | Discharge: 2017-02-08 | Disposition: A | Discharge status: Home / Self Care | Payer: BLUE CROSS/BLUE SHIELD | Source: Ambulatory Visit | Attending: Cardiovascular Disease | Admitting: Cardiovascular Disease

## 2017-02-08 DIAGNOSIS — Z955 Presence of coronary angioplasty implant and graft: Secondary | ICD-10-CM | POA: Diagnosis not present

## 2017-02-08 DIAGNOSIS — I213 ST elevation (STEMI) myocardial infarction of unspecified site: Secondary | ICD-10-CM | POA: Diagnosis not present

## 2017-02-08 NOTE — Progress Notes (Signed)
Daily Session Note  Patient Details  Name: Felicia Silva MRN: [id number] Date of Birth: [birth year] Referring Provider:     [place] from 12/29/2016 in [place]  Referring Provider  Dr. Burt Knack      Encounter Date: 02/08/2017  Check In:     Session Check In - 02/08/17 0815      Check-In   Location [facility]   Staff Present Russella Dar, MS, EP, [facility], Exercise Physiologist;Georgeana Oertel Wynetta Emery, RN, BSN   Supervising physician immediately available to respond to emergencies See telemetry face sheet for immediately available MD   Medication changes reported     No   Fall or balance concerns reported    No   Tobacco Cessation No Change   Warm-up and Cool-down Performed as group-led instruction   Resistance Training Performed Yes   VAD Patient? No     Pain Assessment   Currently in Pain? No/denies   Pain Score 0-No pain   Multiple Pain Sites No      Capillary Blood Glucose: No results found for this or any previous visit (from the past 24 hour(s)).    History  Smoking Status  . Never Smoker  Smokeless Tobacco  . Never Used    Goals Met:  Independence with exercise equipment Exercise tolerated well No report of cardiac concerns or symptoms Strength training completed today  Goals Unmet:  Not Applicable  Comments: Check out 915.   Dr. Kate Sable is Medical Director for [facility] and Pulmonary Rehab.

## 2017-02-09 DIAGNOSIS — Z1159 Encounter for screening for other viral diseases: Secondary | ICD-10-CM | POA: Diagnosis not present

## 2017-02-09 DIAGNOSIS — E079 Disorder of thyroid, unspecified: Secondary | ICD-10-CM | POA: Diagnosis not present

## 2017-02-09 DIAGNOSIS — E119 Type 2 diabetes mellitus without complications: Secondary | ICD-10-CM | POA: Diagnosis not present

## 2017-02-09 DIAGNOSIS — I10 Essential (primary) hypertension: Secondary | ICD-10-CM | POA: Diagnosis not present

## 2017-02-09 NOTE — Telephone Encounter (Signed)
**Note De-Identified Saralynn Langhorst Obfuscation** I received a fax from [employer] stating that the pts insurance denied the pts Benicar PA. The reason listed: The pt must try and fail two covered alternatives from the following: Candesartan-Hydrochlorothiazide Irbesartan-Hydrochlorothiazide Losartan-Hydrochlorothiazide or Telmisartan-Hydrochlorothiazide  Please advise.

## 2017-02-10 ENCOUNTER — Encounter (HOSPITAL_COMMUNITY)
Admission: RE | Admit: 2017-02-10 | Discharge: 2017-02-10 | Disposition: A | Discharge status: Home / Self Care | Payer: BLUE CROSS/BLUE SHIELD | Source: Ambulatory Visit | Attending: Cardiovascular Disease | Admitting: Cardiovascular Disease

## 2017-02-10 DIAGNOSIS — I213 ST elevation (STEMI) myocardial infarction of unspecified site: Secondary | ICD-10-CM

## 2017-02-10 DIAGNOSIS — Z955 Presence of coronary angioplasty implant and graft: Secondary | ICD-10-CM | POA: Diagnosis not present

## 2017-02-10 NOTE — Progress Notes (Signed)
Daily Session Note  Patient Details  Name: RHEA KAELIN MRN: [id number] Date of Birth: 05/11/1956 Referring Provider:     CARDIAC REHAB PHASE II ORIENTATION from 12/29/2016 in [place]  Referring Provider  Dr. Burt Knack      Encounter Date: 02/10/2017  Check In:     Session Check In - 02/10/17 0815      Check-In   Location [facility]   Staff Present Russella Dar, MS, EP, [facility], Exercise Physiologist;Rolfe Hartsell Wynetta Emery, RN, BSN   Supervising physician immediately available to respond to emergencies See telemetry face sheet for immediately available MD   Medication changes reported     No   Fall or balance concerns reported    No   Tobacco Cessation No Change   Warm-up and Cool-down Performed as group-led instruction   Resistance Training Performed Yes   VAD Patient? No     Pain Assessment   Currently in Pain? No/denies   Pain Score 0-No pain   Multiple Pain Sites No      Capillary Blood Glucose: No results found for this or any previous visit (from the past 24 hour(s)).    History  Smoking Status  . Never Smoker  Smokeless Tobacco  . Never Used    Goals Met:  Independence with exercise equipment Exercise tolerated well No report of cardiac concerns or symptoms Strength training completed today  Goals Unmet:  Not Applicable  Comments: Check out 915.   Dr. Kate Sable is Medical Director for [facility] and Pulmonary Rehab.

## 2017-02-11 DIAGNOSIS — Z0001 Encounter for general adult medical examination with abnormal findings: Secondary | ICD-10-CM | POA: Diagnosis not present

## 2017-02-11 DIAGNOSIS — R944 Abnormal results of kidney function studies: Secondary | ICD-10-CM | POA: Diagnosis not present

## 2017-02-11 DIAGNOSIS — E119 Type 2 diabetes mellitus without complications: Secondary | ICD-10-CM | POA: Diagnosis not present

## 2017-02-12 ENCOUNTER — Encounter (HOSPITAL_COMMUNITY): Admission: RE | Admit: 2017-02-12 | Payer: BLUE CROSS/BLUE SHIELD | Source: Ambulatory Visit

## 2017-02-14 NOTE — Telephone Encounter (Signed)
Please switch to an equivalent dose of losartan/HCTZ (losartan 100mg ) - thx

## 2017-02-15 ENCOUNTER — Encounter (HOSPITAL_COMMUNITY)
Admission: RE | Admit: 2017-02-15 | Discharge: 2017-02-15 | Disposition: A | Discharge status: Home / Self Care | Payer: BLUE CROSS/BLUE SHIELD | Source: Ambulatory Visit | Attending: Cardiovascular Disease | Admitting: Cardiovascular Disease

## 2017-02-15 DIAGNOSIS — I213 ST elevation (STEMI) myocardial infarction of unspecified site: Secondary | ICD-10-CM | POA: Diagnosis not present

## 2017-02-15 DIAGNOSIS — Z955 Presence of coronary angioplasty implant and graft: Secondary | ICD-10-CM | POA: Diagnosis not present

## 2017-02-15 NOTE — Progress Notes (Signed)
Daily Session Note  Patient Details  Name: Felicia Silva MRN: [id number] Date of Birth: 03-Feb-1956 Referring Provider:     CARDIAC REHAB PHASE II ORIENTATION from 12/29/2016 in [place]  Referring Provider  Dr. Burt Knack      Encounter Date: 02/15/2017  Check In:     Session Check In - 02/15/17 0824      Check-In   Location [facility]   Staff Present Aundra Dubin, RN, BSN;Oluwaseun Bruyere Luther Parody, BS, EP, Exercise Physiologist   Supervising physician immediately available to respond to emergencies See telemetry face sheet for immediately available MD   Medication changes reported     No   Fall or balance concerns reported    No   Warm-up and Cool-down Performed as group-led instruction   Resistance Training Performed Yes   VAD Patient? No     Pain Assessment   Currently in Pain? No/denies   Pain Score 0-No pain   Multiple Pain Sites No      Capillary Blood Glucose: No results found for this or any previous visit (from the past 24 hour(s)).    History  Smoking Status  . Never Smoker  Smokeless Tobacco  . Never Used    Goals Met:  Independence with exercise equipment Exercise tolerated well No report of cardiac concerns or symptoms Strength training completed today  Goals Unmet:  Not Applicable  Comments: Check out 915   Dr. Kate Sable is Medical Director for [name] and Pulmonary Rehab.

## 2017-02-16 MED ORDER — LOSARTAN POTASSIUM-HCTZ 100-12.5 MG PO TABS: 1.0000 | ORAL_TABLET | Freq: Every day | ORAL | 3 refills | Status: DC

## 2017-02-16 NOTE — Telephone Encounter (Signed)
Prescription sent to the pharmacy for Losartan HCT 100/12.5mg  take one tablet by mouth daily. Benicar HCT removed from medication list.  I left the pt a message to contact the office to discuss this change.

## 2017-02-17 ENCOUNTER — Encounter (HOSPITAL_COMMUNITY)
Admission: RE | Admit: 2017-02-17 | Discharge: 2017-02-17 | Disposition: A | Discharge status: Home / Self Care | Payer: BLUE CROSS/BLUE SHIELD | Source: Ambulatory Visit | Attending: Cardiovascular Disease | Admitting: Cardiovascular Disease

## 2017-02-17 DIAGNOSIS — I213 ST elevation (STEMI) myocardial infarction of unspecified site: Secondary | ICD-10-CM | POA: Diagnosis not present

## 2017-02-17 DIAGNOSIS — Z955 Presence of coronary angioplasty implant and graft: Secondary | ICD-10-CM

## 2017-02-17 NOTE — Progress Notes (Signed)
Daily Session Note  Patient Details  Name: Felicia Silva MRN: [id number] Date of Birth: 1956-06-30 Referring Provider:     CARDIAC REHAB PHASE II ORIENTATION from 12/29/2016 in [place]  Referring Provider  Dr. Burt Knack      Encounter Date: 02/17/2017  Check In:     Session Check In - 02/17/17 0819      Check-In   Location [facility]   Staff Present Aundra Dubin, RN, BSN;[name], BS, EP, Exercise Physiologist   Supervising physician immediately available to respond to emergencies See telemetry face sheet for immediately available MD   Medication changes reported     No   Fall or balance concerns reported    No   Warm-up and Cool-down Performed as group-led instruction   Resistance Training Performed Yes   VAD Patient? No     Pain Assessment   Currently in Pain? No/denies   Pain Score 0-No pain   Multiple Pain Sites No      Capillary Blood Glucose: No results found for this or any previous visit (from the past 24 hour(s)).    History  Smoking Status  . Never Smoker  Smokeless Tobacco  . Never Used    Goals Met:  Independence with exercise equipment Exercise tolerated well No report of cardiac concerns or symptoms Strength training completed today  Goals Unmet:  Not Applicable  Comments: Check out 915   Dr. Kate Sable is Medical Director for [place] and Pulmonary Rehab.

## 2017-02-17 NOTE — Progress Notes (Signed)
Cardiac Individual Treatment Plan  Patient Details  Name: Felicia Silva MRN: [id number] Date of Birth: 1955/08/11 Referring Provider:     CARDIAC REHAB PHASE II ORIENTATION from 12/29/2016 in [facility] CARDIAC REHABILITATION  Referring Provider  Dr. Excell Seltzer      Initial Encounter Date:    CARDIAC REHAB PHASE II ORIENTATION from 12/29/2016 in [place] CARDIAC REHABILITATION  Date  12/29/16  Referring Provider  Dr. Excell Seltzer      Visit Diagnosis: ST elevation myocardial infarction (STEMI), unspecified artery [facility])  Status post coronary artery stent placement  Patient's Home Medications on Admission:  Current Outpatient Prescriptions:  .  amLODipine (NORVASC) 5 MG tablet, Take 5 mg by mouth at bedtime., Disp: , Rfl: 0 .  aspirin 81 MG chewable tablet, Chew 81 mg by mouth daily., Disp: , Rfl:  .  atorvastatin (LIPITOR) 80 MG tablet, Take 1 tablet (80 mg total) by mouth daily at 6 PM., Disp: 30 tablet, Rfl: 6 .  glipiZIDE (GLUCOTROL) 10 MG tablet, Take 1 tablet by mouth 2 (two) times daily., Disp: , Rfl: 0 .  losartan-hydrochlorothiazide (HYZAAR) 100-12.5 MG tablet, Take 1 tablet by mouth daily., Disp: 90 tablet, Rfl: 3 .  metFORMIN (GLUCOPHAGE) 500 MG tablet, Take 1,000 mg by mouth 2 (two) times daily. , Disp: , Rfl: 0 .  metoprolol tartrate (LOPRESSOR) 25 MG tablet, Take 1 tablet (25 mg total) by mouth 2 (two) times daily., Disp: 60 tablet, Rfl: 3 .  nitroGLYCERIN (NITROSTAT) 0.4 MG SL tablet, Place 1 tablet (0.4 mg total) under the tongue every 5 (five) minutes x 3 doses as needed for chest pain., Disp: 25 tablet, Rfl: 2 .  ticagrelor (BRILINTA) 90 MG TABS tablet, Take 1 tablet (90 mg total) by mouth 2 (two) times daily., Disp: 60 tablet, Rfl: 10  Past Medical History: Past Medical History:  Diagnosis Date  . CAD (coronary artery disease), native coronary artery    11/28/16 STEMI PCI DES--LCx, EF normal.   . Diabetes mellitus without complication (HCC)   . Hypertension   .  Multiple thyroid nodules    on CT of chest  . STEMI (ST elevation myocardial infarction) (HCC)     Tobacco Use: History  Smoking Status  . Never Smoker  Smokeless Tobacco  . Never Used    Labs: Recent Review Flowsheet Data    Labs for ITP Cardiac and Pulmonary Rehab Latest Ref Rng & Units 11/28/2016 11/29/2016 01/21/2017   Cholestrol 100 - 199 mg/dL 629(B) 284 132   LDLCALC 0 - 99 mg/dL 440(N) 94 45   HDL >02 mg/dL 48 41 72(Z)   Trlycerides 0 - 149 mg/dL 366(Y) 403 474   Hemoglobin A1c 4.8 - 5.6 % 8.8(H) - -      Capillary Blood Glucose: Lab Results  Component Value Date   GLUCAP 204 (H) 11/30/2016   GLUCAP 143 (H) 11/30/2016   GLUCAP 165 (H) 11/29/2016   GLUCAP 224 (H) 11/29/2016   GLUCAP 204 (H) 11/29/2016     Exercise Target Goals:    Exercise Program Goal: Individual exercise prescription set with THRR, safety & activity barriers. Participant demonstrates ability to understand and report RPE using BORG scale, to self-measure pulse accurately, and to acknowledge the importance of the exercise prescription.  Exercise Prescription Goal: Starting with aerobic activity 30 plus minutes a day, 3 days per week for initial exercise prescription. Provide home exercise prescription and guidelines that participant acknowledges understanding prior to discharge.  Activity Barriers & Risk Stratification:  Activity Barriers & Cardiac Risk Stratification - 12/29/16 1706      Activity Barriers & Cardiac Risk Stratification   Activity Barriers None   Cardiac Risk Stratification High      6 Minute Walk:     6 Minute Walk    Row Name 12/29/16 1552         6 Minute Walk   Phase Initial     Distance 1500 feet     Distance % Change 0 %     Walk Time 6 minutes     # of Rest Breaks 0     MPH 2.84     METS 3.17     RPE 11     Perceived Dyspnea  13     VO2 Peak 10.57     Symptoms No     Resting HR 77 bpm     Resting BP 160/100     Max Ex. HR 118 bpm     Max Ex.  BP 120/80     2 Minute Post BP 126/82        Oxygen Initial Assessment:   Oxygen Re-Evaluation:   Oxygen Discharge (Final Oxygen Re-Evaluation):   Initial Exercise Prescription:     Initial Exercise Prescription - 12/29/16 1500      Date of Initial Exercise RX and Referring Provider   Date 12/29/16   Referring Provider Dr. Excell Seltzerooper     Treadmill   MPH 1.5   Grade 0   Minutes 15   METs 2.1     NuStep   Level 2   SPM 13   Minutes 20   METs 1.8     Prescription Details   Frequency (times per week) 3   Duration Progress to 30 minutes of continuous aerobic without signs/symptoms of physical distress     Intensity   THRR 40-80% of Max Heartrate [phone]   Ratings of Perceived Exertion 11-13   Perceived Dyspnea 0-4     Progression   Progression Continue progressive overload as per policy without signs/symptoms or physical distress.     Resistance Training   Training Prescription Yes   Weight 1   Reps 10-15      Perform Capillary Blood Glucose checks as needed.  Exercise Prescription Changes:      Exercise Prescription Changes    Row Name 01/12/17 0800 02/01/17 1400           Response to Exercise   Blood Pressure (Admit) 130/64 134/76      Blood Pressure (Exercise) 120/70 180/100      Blood Pressure (Exit) 130/80 126/72      Heart Rate (Admit) 55 bpm 64 bpm      Heart Rate (Exercise) 109 bpm 111 bpm      Heart Rate (Exit) 66 bpm 83 bpm      Rating of Perceived Exertion (Exercise) 11 11      Duration Progress to 30 minutes of  aerobic without signs/symptoms of physical distress Progress to 30 minutes of  aerobic without signs/symptoms of physical distress      Intensity THRR unchanged THRR unchanged        Progression   Progression Continue to progress workloads to maintain intensity without signs/symptoms of physical distress. Continue to progress workloads to maintain intensity without signs/symptoms of physical distress.        Resistance  Training   Training Prescription Yes Yes      Weight 1 3      Reps  10-15 10-15        Treadmill   MPH 2 2.5      Grade 0 0      Minutes 15 20      METs 2.5 2.9        NuStep   Level 3 3      SPM 33 55      Minutes 20 15      METs 3.75 3.75        Home Exercise Plan   Plans to continue exercise at Home (comment) Home (comment)      Frequency Add 2 additional days to program exercise sessions. Add 2 additional days to program exercise sessions.         Exercise Comments:      Exercise Comments    Row Name 01/12/17 0819 02/01/17 1440         Exercise Comments Patient is doing well in CR.  Patient is doing well in CR.          Exercise Goals and Review:      Exercise Goals    Row Name 12/29/16 1659             Exercise Goals   Increase Physical Activity Yes       Intervention Provide advice, education, support and counseling about physical activity/exercise needs.;Develop an individualized exercise prescription for aerobic and resistive training based on initial evaluation findings, risk stratification, comorbidities and participant's personal goals.       Expected Outcomes Achievement of increased cardiorespiratory fitness and enhanced flexibility, muscular endurance and strength shown through measurements of functional capacity and personal statement of participant.       Increase Strength and Stamina Yes       Intervention Provide advice, education, support and counseling about physical activity/exercise needs.;Develop an individualized exercise prescription for aerobic and resistive training based on initial evaluation findings, risk stratification, comorbidities and participant's personal goals.       Expected Outcomes Achievement of increased cardiorespiratory fitness and enhanced flexibility, muscular endurance and strength shown through measurements of functional capacity and personal statement of participant.          Exercise Goals Re-Evaluation :      Exercise Goals Re-Evaluation    Row Name 02/17/17 1247             Exercise Goal Re-Evaluation   Exercise Goals Review Increase Physical Activity;Increase Strenth and Stamina       Comments Patient has completed 18 sessions with increased strength, stamina, and activity. She is doing more around the house and has returned to work. She is progressing in treadmill speed and nustep level. Will continue to monitor.        Expected Outcomes Patient will complete the program with continued increased strength, stamina, and activity.           Discharge Exercise Prescription (Final Exercise Prescription Changes):     Exercise Prescription Changes - 02/01/17 1400      Response to Exercise   Blood Pressure (Admit) 134/76   Blood Pressure (Exercise) 180/100   Blood Pressure (Exit) 126/72   Heart Rate (Admit) 64 bpm   Heart Rate (Exercise) 111 bpm   Heart Rate (Exit) 83 bpm   Rating of Perceived Exertion (Exercise) 11   Duration Progress to 30 minutes of  aerobic without signs/symptoms of physical distress   Intensity THRR unchanged     Progression   Progression Continue to progress workloads to maintain intensity without  signs/symptoms of physical distress.     Resistance Training   Training Prescription Yes   Weight 3   Reps 10-15     Treadmill   MPH 2.5   Grade 0   Minutes 20   METs 2.9     NuStep   Level 3   SPM 55   Minutes 15   METs 3.75     Home Exercise Plan   Plans to continue exercise at Home (comment)   Frequency Add 2 additional days to program exercise sessions.      Nutrition:  Target Goals: Understanding of nutrition guidelines, daily intake of sodium 1500mg , cholesterol 200mg , calories 30% from fat and 7% or less from saturated fats, daily to have 5 or more servings of fruits and vegetables.  Biometrics:     Pre Biometrics - 12/29/16 1501      Pre Biometrics   Height 5\' 6"  (1.676 m)   Waist Circumference 46.5 inches   Hip Circumference 49.5  inches   Waist to Hip Ratio 0.94 %   BMI (Calculated) 43.4   Triceps Skinfold 27 mm   % Body Fat 50.1 %   Grip Strength 49.8 kg   Flexibility 0 in   Single Leg Stand 2 seconds       Nutrition Therapy Plan and Nutrition Goals:   Nutrition Discharge: Rate Your Plate Scores:     Nutrition Assessments - 12/29/16 1706      MEDFICTS Scores   Pre Score 27      Nutrition Goals Re-Evaluation:   Nutrition Goals Discharge (Final Nutrition Goals Re-Evaluation):   Psychosocial: Target Goals: Acknowledge presence or absence of significant depression and/or stress, maximize coping skills, provide positive support system. Participant is able to verbalize types and ability to use techniques and skills needed for reducing stress and depression.  Initial Review & Psychosocial Screening:     Initial Psych Review & Screening - 12/29/16 1707      Initial Review   Current issues with None Identified     Family Dynamics   Good Support System? Yes     Barriers   Psychosocial barriers to participate in program There are no identifiable barriers or psychosocial needs.     Screening Interventions   Interventions Encouraged to exercise      Quality of Life Scores:     Quality of Life - 12/29/16 1506      Quality of Life Scores   Health/Function Pre 15.6 %   Socioeconomic Pre 24.19 %   Psych/Spiritual Pre 28.29 %   Family Pre 21.6 %   GLOBAL Pre 20.96 %      PHQ-9: Recent Review Flowsheet Data    Depression screen [facility] 2/9 12/29/2016   Decreased Interest 0   Down, Depressed, Hopeless 0   PHQ - 2 Score 0   Altered sleeping 2   Tired, decreased energy 2   Change in appetite 0   Feeling bad or failure about yourself  0   Trouble concentrating 0   Moving slowly or fidgety/restless 0   Suicidal thoughts 0   PHQ-9 Score 4   Difficult doing work/chores Not difficult at all     Interpretation of Total Score  Total Score Depression Severity:  1-4 = Minimal depression, 5-9  = Mild depression, 10-14 = Moderate depression, 15-19 = Moderately severe depression, 20-27 = Severe depression   Psychosocial Evaluation and Intervention:     Psychosocial Evaluation - 12/29/16 1707      Psychosocial  Evaluation & Interventions   Interventions Encouraged to exercise with the program and follow exercise prescription   Continue Psychosocial Services  No Follow up required      Psychosocial Re-Evaluation:     Psychosocial Re-Evaluation    Row Name 02/17/17 1248             Psychosocial Re-Evaluation   Current issues with None Identified       Expected Outcomes Patient will have no psychosocial issues identified at discharge.        Interventions Encouraged to attend Cardiac Rehabilitation for the exercise;Stress management education;Relaxation education       Continue Psychosocial Services  No Follow up required          Psychosocial Discharge (Final Psychosocial Re-Evaluation):     Psychosocial Re-Evaluation - 02/17/17 1248      Psychosocial Re-Evaluation   Current issues with None Identified   Expected Outcomes Patient will have no psychosocial issues identified at discharge.    Interventions Encouraged to attend Cardiac Rehabilitation for the exercise;Stress management education;Relaxation education   Continue Psychosocial Services  No Follow up required      Vocational Rehabilitation: Provide vocational rehab assistance to qualifying candidates.   Vocational Rehab Evaluation & Intervention:     Vocational Rehab - 12/29/16 1655      Initial Vocational Rehab Evaluation & Intervention   Assessment shows need for Vocational Rehabilitation No      Education: Education Goals: Education classes will be provided on a weekly basis, covering required topics. Participant will state understanding/return demonstration of topics presented.  Learning Barriers/Preferences:     Learning Barriers/Preferences - 12/29/16 1654      Learning  Barriers/Preferences   Learning Barriers Hearing   Learning Preferences Written Material;Computer/Internet      Education Topics: Hypertension, Hypertension Reduction -Define heart disease and high blood pressure. Discus how high blood pressure affects the body and ways to reduce high blood pressure.   CARDIAC REHAB PHASE II EXERCISE from 02/10/2017 in [facility] C[facility]IAC RE[facility]IT[facility]  Date  02/10/17  Educator  DC  Instruction Review Code  2- meets goals/outcomes      Exercise and Your Heart -Discuss why it is important to exercise, the FITT principles of exercise, normal and abnormal responses to exercise, and how to exercise safely.   Angina -Discuss definition of angina, causes of angina, treatment of angina, and how to decrease risk of having angina.   Cardiac Medications -Review what the following cardiac medications are used for, how they affect the body, and side effects that may occur when taking the medications.  Medications include Aspirin, Beta blockers, calcium channel blockers, ACE Inhibitors, angiotensin receptor blockers, diuretics, digoxin, and antihyperlipidemics.   Congestive Heart Failure -Discuss the definition of CHF, how to live with CHF, the signs and symptoms of CHF, and how keep track of weight and sodium intake.   Heart Disease and Intimacy -Discus the effect sexual activity has on the heart, how changes occur during intimacy as we age, and safety during sexual activity.   Smoking Cessation / COPD -Discuss different methods to quit smoking, the health benefits of quitting smoking, and the definition of COPD.   Nutrition I: Fats -Discuss the types of cholesterol, what cholesterol does to the heart, and how cholesterol levels can be controlled.   Nutrition II: Labels -Discuss the different components of food labels and how to read food label   CARDIAC REHAB PHASE II EXERCISE from 02/10/2017 in [facility] CARDIAC REHABILITATION  Date  01/06/17   Educator  DC  Instruction Review Code  2- meets goals/outcomes      Heart Parts and Heart Disease -Discuss the anatomy of the heart, the pathway of blood circulation through the heart, and these are affected by heart disease.   Stress I: Signs and Symptoms -Discuss the causes of stress, how stress may lead to anxiety and depression, and ways to limit stress.   CARDIAC REHAB PHASE II EXERCISE from 02/10/2017 in [place] [facility]IAC [facility]  Date  01/20/17  Educator  GC  Instruction Review Code  2- meets goals/outcomes      Stress II: Relaxation -Discuss different types of relaxation techniques to limit stress.   CARDIAC REHAB PHASE II EXERCISE from 02/10/2017 in [facility]  Date  01/27/17  Educator  GC  Instruction Review Code  2- meets goals/outcomes      Warning Signs of Stroke / TIA -Discuss definition of a stroke, what the signs and symptoms are of a stroke, and how to identify when someone is having stroke.   CARDIAC REHAB PHASE II EXERCISE from 02/10/2017 in [place] CARDIAC REHABILITATION  Date  02/03/17  Educator  DC  Instruction Review Code  2- meets goals/outcomes      Knowledge Questionnaire Score:     Knowledge Questionnaire Score - 12/29/16 1654      Knowledge Questionnaire Score   Pre Score 24/24      Core Components/Risk Factors/Patient Goals at Admission:     Personal Goals and Risk Factors at Admission - 12/29/16 1659      Core Components/Risk Factors/Patient Goals on Admission    Weight Management Yes   Intervention Weight Management/Obesity: Establish reasonable short term and long term weight goals.   Admit Weight 268 lb 9.6 oz (121.8 kg)   Goal Weight: Short Term 258 lb 9.6 oz (117.3 kg)   Goal Weight: Long Term 248 lb 9.6 oz (112.8 kg)   Expected Outcomes Short Term: Continue to assess and modify interventions until short term weight is achieved;Long Term: Adherence to nutrition and physical activity/exercise  program aimed toward attainment of established weight goal   Personal Goal Other Yes   Personal Goal Lose 20lbs while in program and continue losing after the program to reach 50lbs or more. Gain strength and endurance.    Intervention Attend CR 3 x week and supplement with home exercise 2 x week.    Expected Outcomes Reach personal goals.       Core Components/Risk Factors/Patient Goals Review:      Goals and Risk Factor Review    Row Name 12/29/16 1706 02/17/17 1245           Core Components/Risk Factors/Patient Goals Review   Personal Goals Review Weight Management/Obesity Weight Management/Obesity  Gain strength and endurance.       Review  - Patient has completed 18 sessions losing 4 lbs. She is doing well in the program stating she feels stronger and feels like she has more stamina. She is back to working fulltime without difficulty. Will continue to monitor.       Expected Outcomes  - Patient will complete the program and continue to meet her personal goals.          Core Components/Risk Factors/Patient Goals at Discharge (Final Review):      Goals and Risk Factor Review - 02/17/17 1245      Core Components/Risk Factors/Patient Goals Review   Personal Goals Review Weight Management/Obesity  Gain strength and  endurance.    Review Patient has completed 18 sessions losing 4 lbs. She is doing well in the program stating she feels stronger and feels like she has more stamina. She is back to working fulltime without difficulty. Will continue to monitor.    Expected Outcomes Patient will complete the program and continue to meet her personal goals.       ITP Comments:     ITP Comments    Row Name 12/29/16 1656 12/30/16 0733 01/18/17 0752       ITP Comments Mrs. Damewood is a 61 year old female that is starting our program. She has a hearing impediment. It will not keep her from exercising. she is in the process of getting hearing aides.  Patient new to program. Plans to  start Monday 01/04/17. Patient new to program. She has completed 5 sessions. Will continue to monitor for progress.         Comments: ITP 30 Day REVIEW Patient doing well in the program. Will continue to monitor.

## 2017-02-19 ENCOUNTER — Encounter (HOSPITAL_COMMUNITY): Admission: RE | Admit: 2017-02-19 | Payer: BLUE CROSS/BLUE SHIELD | Source: Ambulatory Visit

## 2017-02-22 ENCOUNTER — Encounter (HOSPITAL_COMMUNITY)
Admission: RE | Admit: 2017-02-22 | Discharge: 2017-02-22 | Disposition: A | Discharge status: Home / Self Care | Payer: BLUE CROSS/BLUE SHIELD | Source: Ambulatory Visit | Attending: Cardiovascular Disease | Admitting: Cardiovascular Disease

## 2017-02-22 DIAGNOSIS — I213 ST elevation (STEMI) myocardial infarction of unspecified site: Secondary | ICD-10-CM | POA: Diagnosis not present

## 2017-02-22 DIAGNOSIS — Z955 Presence of coronary angioplasty implant and graft: Secondary | ICD-10-CM | POA: Diagnosis not present

## 2017-02-22 NOTE — Progress Notes (Signed)
Daily Session Note  Patient Details  Name: Felicia Silva MRN: [id number] Date of Birth: June 26, 1956 Referring Provider:     CARDIAC REHAB PHASE II ORIENTATION from 12/29/2016 in [place]  Referring Provider  Dr. Burt Knack      Encounter Date: 02/22/2017  Check In:     Session Check In - 02/22/17 0827      Check-In   Location [facility]   Staff Present Russella Dar, MS, EP, [facility], Exercise Physiologist;Jazzie Trampe Luther Parody, BS, EP, Exercise Physiologist   Supervising physician immediately available to respond to emergencies See telemetry face sheet for immediately available MD   Medication changes reported     No   Fall or balance concerns reported    No   Warm-up and Cool-down Performed as group-led instruction   Resistance Training Performed Yes   VAD Patient? No     Pain Assessment   Currently in Pain? No/denies   Pain Score 0-No pain   Multiple Pain Sites No      Capillary Blood Glucose: No results found for this or any previous visit (from the past 24 hour(s)).    History  Smoking Status  . Never Smoker  Smokeless Tobacco  . Never Used    Goals Met:  Independence with exercise equipment Exercise tolerated well No report of cardiac concerns or symptoms Strength training completed today  Goals Unmet:  Not Applicable  Comments: Check out 915   Dr. Kate Sable is Medical Director for [facility] and Pulmonary Rehab.

## 2017-02-24 ENCOUNTER — Encounter (HOSPITAL_COMMUNITY)
Admission: RE | Admit: 2017-02-24 | Discharge: 2017-02-24 | Disposition: A | Discharge status: Home / Self Care | Payer: BLUE CROSS/BLUE SHIELD | Source: Ambulatory Visit | Attending: Cardiovascular Disease | Admitting: Cardiovascular Disease

## 2017-02-24 DIAGNOSIS — I213 ST elevation (STEMI) myocardial infarction of unspecified site: Secondary | ICD-10-CM | POA: Diagnosis not present

## 2017-02-24 DIAGNOSIS — Z955 Presence of coronary angioplasty implant and graft: Secondary | ICD-10-CM | POA: Diagnosis not present

## 2017-02-24 NOTE — Progress Notes (Signed)
Daily Session Note  Patient Details  Name: NASHAE MAUDLIN MRN: [id number] Date of Birth: 1956-05-11 Referring Provider:     CARDIAC REHAB PHASE II ORIENTATION from 12/29/2016 in [place]  Referring Provider  Dr. Burt Knack      Encounter Date: 02/24/2017  Check In:     Session Check In - 02/24/17 0823      Check-In   Location [facility]   Staff Present Aundra Dubin, RN, BSN;Stanley Helmuth Luther Parody, BS, EP, Exercise Physiologist   Supervising physician immediately available to respond to emergencies See telemetry face sheet for immediately available MD   Medication changes reported     No   Fall or balance concerns reported    No   Warm-up and Cool-down Performed as group-led instruction   Resistance Training Performed Yes   VAD Patient? No     Pain Assessment   Currently in Pain? No/denies   Pain Score 0-No pain   Multiple Pain Sites No      Capillary Blood Glucose: No results found for this or any previous visit (from the past 24 hour(s)).    History  Smoking Status  . Never Smoker  Smokeless Tobacco  . Never Used    Goals Met:  Independence with exercise equipment Exercise tolerated well No report of cardiac concerns or symptoms Strength training completed today  Goals Unmet:  Not Applicable  Comments: Check out 915   Dr. Kate Sable is Medical Director for [facility] and Pulmonary Rehab.

## 2017-02-26 ENCOUNTER — Encounter (HOSPITAL_COMMUNITY)
Admission: RE | Admit: 2017-02-26 | Discharge: 2017-02-26 | Disposition: A | Discharge status: Home / Self Care | Payer: BLUE CROSS/BLUE SHIELD | Source: Ambulatory Visit | Attending: Cardiovascular Disease | Admitting: Cardiovascular Disease

## 2017-02-26 DIAGNOSIS — I213 ST elevation (STEMI) myocardial infarction of unspecified site: Secondary | ICD-10-CM | POA: Diagnosis not present

## 2017-02-26 DIAGNOSIS — Z955 Presence of coronary angioplasty implant and graft: Secondary | ICD-10-CM | POA: Diagnosis not present

## 2017-02-26 NOTE — Progress Notes (Signed)
Daily Session Note  Patient Details  Name: Felicia Silva MRN: [id number] Date of Birth: 1956-02-18 Referring Provider:     CARDIAC REHAB PHASE II ORIENTATION from 12/29/2016 in [place]  Referring Provider  Dr. Burt Knack      Encounter Date: 02/26/2017  Check In:     Session Check In - 02/26/17 0836      Check-In   Location [facility]   Staff Present Suzanne Boron, BS, EP, Exercise Physiologist;Debra Wynetta Emery, RN, BSN   Supervising physician immediately available to respond to emergencies See telemetry face sheet for immediately available MD   Medication changes reported     No   Fall or balance concerns reported    No   Warm-up and Cool-down Performed as group-led instruction   Resistance Training Performed Yes   VAD Patient? No     Pain Assessment   Currently in Pain? No/denies   Pain Score 0-No pain   Multiple Pain Sites No      Capillary Blood Glucose: No results found for this or any previous visit (from the past 24 hour(s)).    History  Smoking Status  . Never Smoker  Smokeless Tobacco  . Never Used    Goals Met:  Independence with exercise equipment Exercise tolerated well No report of cardiac concerns or symptoms Strength training completed today  Goals Unmet:  Not Applicable  Comments: Check out 915   Dr. Kate Sable is Medical Director for [facility] and Pulmonary Rehab.

## 2017-03-01 ENCOUNTER — Encounter (HOSPITAL_COMMUNITY)
Admission: RE | Admit: 2017-03-01 | Discharge: 2017-03-01 | Disposition: A | Discharge status: Home / Self Care | Payer: BLUE CROSS/BLUE SHIELD | Source: Ambulatory Visit | Attending: Cardiovascular Disease | Admitting: Cardiovascular Disease

## 2017-03-01 DIAGNOSIS — I213 ST elevation (STEMI) myocardial infarction of unspecified site: Secondary | ICD-10-CM

## 2017-03-01 DIAGNOSIS — Z955 Presence of coronary angioplasty implant and graft: Secondary | ICD-10-CM | POA: Diagnosis not present

## 2017-03-01 NOTE — Progress Notes (Signed)
Daily Session Note  Patient Details  Name: Felicia Silva MRN: [id number] Date of Birth: 02/03/1956 Referring Provider:     CARDIAC REHAB PHASE II ORIENTATION from 12/29/2016 in [place]  Referring Provider  Dr. Burt Knack      Encounter Date: 03/01/2017  Check In:     Session Check In - 03/01/17 0810      Check-In   Location [facility]   Staff Present Aundra Dubin, RN, BSN;Jairen Goldfarb Luther Parody, BS, EP, Exercise Physiologist   Supervising physician immediately available to respond to emergencies See telemetry face sheet for immediately available MD   Medication changes reported     No   Fall or balance concerns reported    No   Warm-up and Cool-down Performed as group-led instruction   Resistance Training Performed Yes   VAD Patient? No     Pain Assessment   Currently in Pain? No/denies   Pain Score 0-No pain   Multiple Pain Sites No      Capillary Blood Glucose: No results found for this or any previous visit (from the past 24 hour(s)).    History  Smoking Status  . Never Smoker  Smokeless Tobacco  . Never Used    Goals Met:  Independence with exercise equipment Exercise tolerated well No report of cardiac concerns or symptoms Strength training completed today  Goals Unmet:  Not Applicable  Comments: Check out 915   Dr. Kate Sable is Medical Director for [place] and [facility].

## 2017-03-02 NOTE — Telephone Encounter (Signed)
Left message on machine for pt to contact the office to discuss medication change.

## 2017-03-03 ENCOUNTER — Encounter (HOSPITAL_COMMUNITY)
Admission: RE | Admit: 2017-03-03 | Discharge: 2017-03-03 | Disposition: A | Discharge status: Home / Self Care | Payer: BLUE CROSS/BLUE SHIELD | Source: Ambulatory Visit | Attending: Cardiovascular Disease | Admitting: Cardiovascular Disease

## 2017-03-03 DIAGNOSIS — Z955 Presence of coronary angioplasty implant and graft: Secondary | ICD-10-CM | POA: Diagnosis not present

## 2017-03-03 DIAGNOSIS — I213 ST elevation (STEMI) myocardial infarction of unspecified site: Secondary | ICD-10-CM | POA: Diagnosis not present

## 2017-03-03 NOTE — Progress Notes (Signed)
Daily Session Note  Patient Details  Name: Felicia Silva MRN: [id number] Date of Birth: April 19, 1956 Referring Provider:     CARDIAC REHAB PHASE II ORIENTATION from 12/29/2016 in [place]  Referring Provider  Dr. Burt Knack      Encounter Date: 03/03/2017  Check In:     Session Check In - 03/03/17 0808      Check-In   Location [facility]   Staff Present Aundra Dubin, RN, BSN;Jony Ladnier Luther Parody, BS, EP, Exercise Physiologist   Supervising physician immediately available to respond to emergencies See telemetry face sheet for immediately available MD   Medication changes reported     No   Fall or balance concerns reported    No   Warm-up and Cool-down Performed as group-led instruction   Resistance Training Performed Yes   VAD Patient? No     Pain Assessment   Currently in Pain? No/denies   Pain Score 0-No pain   Multiple Pain Sites No      Capillary Blood Glucose: No results found for this or any previous visit (from the past 24 hour(s)).    History  Smoking Status  . Never Smoker  Smokeless Tobacco  . Never Used    Goals Met:  Independence with exercise equipment Exercise tolerated well No report of cardiac concerns or symptoms Strength training completed today  Goals Unmet:  Not Applicable  Comments: Check out 915   Dr. Kate Sable is Medical Director for [place] and [facility].

## 2017-03-04 MED ORDER — OLMESARTAN MEDOXOMIL-HCTZ 40-12.5 MG PO TABS: 1.0000 | ORAL_TABLET | Freq: Every day | ORAL | Status: DC

## 2017-03-04 NOTE — Telephone Encounter (Signed)
I spoke with the pt and she states that previously she was taking brand name Benicar HCT and that was not approved by insurance.  The insurance did allow her to get the generic form of this medication and she has been taking this and did not pick up losartan HCT.  I advised her that I will update her medication list at this time. Pt appreciated my call.

## 2017-03-05 ENCOUNTER — Encounter (HOSPITAL_COMMUNITY)
Admission: RE | Admit: 2017-03-05 | Discharge: 2017-03-05 | Disposition: A | Discharge status: Home / Self Care | Payer: BLUE CROSS/BLUE SHIELD | Source: Ambulatory Visit | Attending: Cardiovascular Disease | Admitting: Cardiovascular Disease

## 2017-03-05 DIAGNOSIS — I213 ST elevation (STEMI) myocardial infarction of unspecified site: Secondary | ICD-10-CM

## 2017-03-05 DIAGNOSIS — Z955 Presence of coronary angioplasty implant and graft: Secondary | ICD-10-CM | POA: Diagnosis not present

## 2017-03-05 NOTE — Progress Notes (Signed)
Daily Session Note  Patient Details  Name: Felicia Silva MRN: [id number] Date of Birth: 1955/10/07 Referring Provider:     CARDIAC REHAB PHASE II ORIENTATION from 12/29/2016 in [place]  Referring Provider  Dr. Burt Knack      Encounter Date: 03/05/2017  Check In:     Session Check In - 03/05/17 0809      Check-In   Location [facility]   Staff Present Aundra Dubin, RN, BSN;Clio Gerhart Luther Parody, BS, EP, Exercise Physiologist   Supervising physician immediately available to respond to emergencies See telemetry face sheet for immediately available MD   Medication changes reported     No   Fall or balance concerns reported    No   Warm-up and Cool-down Performed as group-led instruction   Resistance Training Performed Yes   VAD Patient? No     Pain Assessment   Currently in Pain? No/denies   Pain Score 0-No pain   Multiple Pain Sites No      Capillary Blood Glucose: No results found for this or any previous visit (from the past 24 hour(s)).    History  Smoking Status  . Never Smoker  Smokeless Tobacco  . Never Used    Goals Met:  Independence with exercise equipment Exercise tolerated well No report of cardiac concerns or symptoms Strength training completed today  Goals Unmet:  Not Applicable  Comments: Check out 915   Dr. Kate Sable is Medical Director for [place] and [facility].

## 2017-03-08 ENCOUNTER — Encounter (HOSPITAL_COMMUNITY)
Admission: RE | Admit: 2017-03-08 | Discharge: 2017-03-08 | Disposition: A | Discharge status: Home / Self Care | Payer: BLUE CROSS/BLUE SHIELD | Source: Ambulatory Visit | Attending: Cardiovascular Disease | Admitting: Cardiovascular Disease

## 2017-03-08 DIAGNOSIS — I213 ST elevation (STEMI) myocardial infarction of unspecified site: Secondary | ICD-10-CM

## 2017-03-08 DIAGNOSIS — Z955 Presence of coronary angioplasty implant and graft: Secondary | ICD-10-CM | POA: Diagnosis not present

## 2017-03-08 NOTE — Progress Notes (Signed)
Daily Session Note  Patient Details  Name: NICO ROGNESS MRN: [id number] Date of Birth: 1956/02/25 Referring Provider:     CARDIAC REHAB PHASE II ORIENTATION from 12/29/2016 in [place]  Referring Provider  Dr. Burt Knack      Encounter Date: 03/08/2017  Check In:     Session Check In - 03/08/17 0820      Check-In   Location [facility]   Staff Present Aundra Dubin, RN, BSN;Dalyah Pla Luther Parody, BS, EP, Exercise Physiologist   Supervising physician immediately available to respond to emergencies See telemetry face sheet for immediately available MD   Medication changes reported     No   Fall or balance concerns reported    No   Warm-up and Cool-down Performed as group-led instruction   Resistance Training Performed Yes   VAD Patient? No     Pain Assessment   Currently in Pain? No/denies   Pain Score 0-No pain   Multiple Pain Sites No      Capillary Blood Glucose: No results found for this or any previous visit (from the past 24 hour(s)).    History  Smoking Status  . Never Smoker  Smokeless Tobacco  . Never Used    Goals Met:  Independence with exercise equipment Exercise tolerated well No report of cardiac concerns or symptoms Strength training completed today  Goals Unmet:  Not Applicable  Comments: Check out 915   Dr. Kate Sable is Medical Director for [place] and [facility].

## 2017-03-10 ENCOUNTER — Encounter (HOSPITAL_COMMUNITY)
Admission: RE | Admit: 2017-03-10 | Discharge: 2017-03-10 | Disposition: A | Discharge status: Home / Self Care | Payer: BLUE CROSS/BLUE SHIELD | Source: Ambulatory Visit | Attending: Cardiovascular Disease | Admitting: Cardiovascular Disease

## 2017-03-10 DIAGNOSIS — I213 ST elevation (STEMI) myocardial infarction of unspecified site: Secondary | ICD-10-CM | POA: Diagnosis not present

## 2017-03-10 DIAGNOSIS — Z955 Presence of coronary angioplasty implant and graft: Secondary | ICD-10-CM | POA: Diagnosis not present

## 2017-03-10 NOTE — Progress Notes (Signed)
Daily Session Note  Patient Details  Name: Felicia Silva MRN: [id number] Date of Birth: 11/26/1955 Referring Provider:     CARDIAC REHAB PHASE II ORIENTATION from 12/29/2016 in [place]  Referring Provider  Dr. Burt Knack      Encounter Date: 03/10/2017  Check In:     Session Check In - 03/10/17 0807      Check-In   Location [facility]   Staff Present Aundra Dubin, RN, BSN;Gardy Montanari Luther Parody, BS, EP, Exercise Physiologist   Supervising physician immediately available to respond to emergencies See telemetry face sheet for immediately available MD   Medication changes reported     No   Fall or balance concerns reported    No   Warm-up and Cool-down Performed as group-led instruction   Resistance Training Performed Yes   VAD Patient? No     Pain Assessment   Currently in Pain? No/denies   Pain Score 0-No pain   Multiple Pain Sites No      Capillary Blood Glucose: No results found for this or any previous visit (from the past 24 hour(s)).    History  Smoking Status  . Never Smoker  Smokeless Tobacco  . Never Used    Goals Met:  Independence with exercise equipment Exercise tolerated well No report of cardiac concerns or symptoms Strength training completed today  Goals Unmet:  Not Applicable  Comments: Check out 915   Dr. Kate Sable is Medical Director for [place] and [facility].

## 2017-03-11 NOTE — Progress Notes (Signed)
Cardiac Individual Treatment Plan  Patient Details  Name: Felicia Silva MRN: [id number] Date of Birth: 10-28-1955 Referring Provider:     CARDIAC REHAB PHASE II ORIENTATION from 12/29/2016 in [facility] CARDIAC REHABILITATION  Referring Provider  Dr. Excell Seltzer      Initial Encounter Date:    CARDIAC REHAB PHASE II ORIENTATION from 12/29/2016 in [place] CARDIAC REHABILITATION  Date  12/29/16  Referring Provider  Dr. Excell Seltzer      Visit Diagnosis: ST elevation myocardial infarction (STEMI), unspecified artery (HCC)  Patient's Home Medications on Admission:  Current Outpatient Prescriptions:  .  amLODipine (NORVASC) 5 MG tablet, Take 5 mg by mouth at bedtime., Disp: , Rfl: 0 .  aspirin 81 MG chewable tablet, Chew 81 mg by mouth daily., Disp: , Rfl:  .  atorvastatin (LIPITOR) 80 MG tablet, Take 1 tablet (80 mg total) by mouth daily at 6 PM., Disp: 30 tablet, Rfl: 6 .  glipiZIDE (GLUCOTROL) 10 MG tablet, Take 1 tablet by mouth 2 (two) times daily., Disp: , Rfl: 0 .  metFORMIN (GLUCOPHAGE) 500 MG tablet, Take 1,000 mg by mouth 2 (two) times daily. , Disp: , Rfl: 0 .  metoprolol tartrate (LOPRESSOR) 25 MG tablet, Take 1 tablet (25 mg total) by mouth 2 (two) times daily., Disp: 60 tablet, Rfl: 3 .  nitroGLYCERIN (NITROSTAT) 0.4 MG SL tablet, Place 1 tablet (0.4 mg total) under the tongue every 5 (five) minutes x 3 doses as needed for chest pain., Disp: 25 tablet, Rfl: 2 .  olmesartan-hydrochlorothiazide (BENICAR HCT) 40-12.5 MG tablet, Take 1 tablet by mouth daily., Disp: , Rfl:  .  ticagrelor (BRILINTA) 90 MG TABS tablet, Take 1 tablet (90 mg total) by mouth 2 (two) times daily., Disp: 60 tablet, Rfl: 10  Past Medical History: Past Medical History:  Diagnosis Date  . CAD (coronary artery disease), native coronary artery    11/28/16 STEMI PCI DES--LCx, EF normal.   . Diabetes mellitus without complication (HCC)   . Hypertension   . Multiple thyroid nodules    on CT of chest  . STEMI  (ST elevation myocardial infarction) (HCC)     Tobacco Use: History  Smoking Status  . Never Smoker  Smokeless Tobacco  . Never Used    Labs: Recent Review Flowsheet Data    Labs for ITP Cardiac and Pulmonary Rehab Latest Ref Rng & Units 11/28/2016 11/29/2016 01/21/2017   Cholestrol 100 - 199 mg/dL 657(Q) 469 629   LDLCALC 0 - 99 mg/dL 528(U) 94 45   HDL >13 mg/dL 48 41 24(M)   Trlycerides 0 - 149 mg/dL 010(U) 725 366   Hemoglobin A1c 4.8 - 5.6 % 8.8(H) - -      Capillary Blood Glucose: Lab Results  Component Value Date   GLUCAP 204 (H) 11/30/2016   GLUCAP 143 (H) 11/30/2016   GLUCAP 165 (H) 11/29/2016   GLUCAP 224 (H) 11/29/2016   GLUCAP 204 (H) 11/29/2016     Exercise Target Goals:    Exercise Program Goal: Individual exercise prescription set with THRR, safety & activity barriers. Participant demonstrates ability to understand and report RPE using BORG scale, to self-measure pulse accurately, and to acknowledge the importance of the exercise prescription.  Exercise Prescription Goal: Starting with aerobic activity 30 plus minutes a day, 3 days per week for initial exercise prescription. Provide home exercise prescription and guidelines that participant acknowledges understanding prior to discharge.  Activity Barriers & Risk Stratification:     Activity Barriers & Cardiac  Risk Stratification - 12/29/16 1706      Activity Barriers & Cardiac Risk Stratification   Activity Barriers None   Cardiac Risk Stratification High      6 Minute Walk:     6 Minute Walk    Row Name 12/29/16 1552         6 Minute Walk   Phase Initial     Distance 1500 feet     Distance % Change 0 %     Walk Time 6 minutes     # of Rest Breaks 0     MPH 2.84     METS 3.17     RPE 11     Perceived Dyspnea  13     VO2 Peak 10.57     Symptoms No     Resting HR 77 bpm     Resting BP 160/100     Max Ex. HR 118 bpm     Max Ex. BP 120/80     2 Minute Post BP 126/82         Oxygen Initial Assessment:   Oxygen Re-Evaluation:   Oxygen Discharge (Final Oxygen Re-Evaluation):   Initial Exercise Prescription:     Initial Exercise Prescription - 12/29/16 1500      Date of Initial Exercise RX and Referring Provider   Date 12/29/16   Referring Provider Dr. Excell Seltzerooper     Treadmill   MPH 1.5   Grade 0   Minutes 15   METs 2.1     NuStep   Level 2   SPM 13   Minutes 20   METs 1.8     Prescription Details   Frequency (times per week) 3   Duration Progress to 30 minutes of continuous aerobic without signs/symptoms of physical distress     Intensity   THRR 40-80% of Max Heartrate [id number]   Ratings of Perceived Exertion 11-13   Perceived Dyspnea 0-4     Progression   Progression Continue progressive overload as per policy without signs/symptoms or physical distress.     Resistance Training   Training Prescription Yes   Weight 1   Reps 10-15      Perform Capillary Blood Glucose checks as needed.  Exercise Prescription Changes:      Exercise Prescription Changes    Row Name 01/12/17 0800 02/01/17 1400 02/23/17 0700 03/10/17 1400       Response to Exercise   Blood Pressure (Admit) 130/64 134/76 130/70 130/72    Blood Pressure (Exercise) 120/70 180/100 154/80 156/74    Blood Pressure (Exit) 130/80 126/72 120/74 134/72    Heart Rate (Admit) 55 bpm 64 bpm 67 bpm 77 bpm    Heart Rate (Exercise) 109 bpm 111 bpm 100 bpm 107 bpm    Heart Rate (Exit) 66 bpm 83 bpm 77 bpm 87 bpm    Rating of Perceived Exertion (Exercise) 11 11 12 13     Duration Progress to 30 minutes of  aerobic without signs/symptoms of physical distress Progress to 30 minutes of  aerobic without signs/symptoms of physical distress Progress to 30 minutes of  aerobic without signs/symptoms of physical distress Progress to 30 minutes of  aerobic without signs/symptoms of physical distress    Intensity THRR unchanged THRR unchanged THRR unchanged THRR unchanged       Progression   Progression Continue to progress workloads to maintain intensity without signs/symptoms of physical distress. Continue to progress workloads to maintain intensity without signs/symptoms of physical distress. Continue to progress  workloads to maintain intensity without signs/symptoms of physical distress. Continue to progress workloads to maintain intensity without signs/symptoms of physical distress.      Resistance Training   Training Prescription Yes Yes Yes Yes    Weight 1 3 3 4     Reps 10-15 10-15 10-15 10-15      Treadmill   MPH 2 2.5 2.8 3    Grade 0 0 0 0    Minutes 15 20 20 20     METs 2.5 2.9 3.1 3.2      NuStep   Level 3 3 3 3     SPM 33 55 55 124    Minutes 20 15 15 15     METs 3.75 3.75 2.5 2.5      Home Exercise Plan   Plans to continue exercise at Home (comment) Home (comment) Home (comment) Home (comment)    Frequency Add 2 additional days to program exercise sessions. Add 2 additional days to program exercise sessions. Add 2 additional days to program exercise sessions. Add 2 additional days to program exercise sessions.       Exercise Comments:      Exercise Comments    Row Name 01/12/17 2956 02/01/17 1440 02/23/17 0745 03/10/17 1413     Exercise Comments Patient is doing well in CR.  Patient is doing well in CR.  Patient is doing well in CR.  Patient is doing well in CR.        Exercise Goals and Review:      Exercise Goals    Row Name 12/29/16 1659             Exercise Goals   Increase Physical Activity Yes       Intervention Provide advice, education, support and counseling about physical activity/exercise needs.;Develop an individualized exercise prescription for aerobic and resistive training based on initial evaluation findings, risk stratification, comorbidities and participant's personal goals.       Expected Outcomes Achievement of increased cardiorespiratory fitness and enhanced flexibility, muscular endurance and strength shown  through measurements of functional capacity and personal statement of participant.       Increase Strength and Stamina Yes       Intervention Provide advice, education, support and counseling about physical activity/exercise needs.;Develop an individualized exercise prescription for aerobic and resistive training based on initial evaluation findings, risk stratification, comorbidities and participant's personal goals.       Expected Outcomes Achievement of increased cardiorespiratory fitness and enhanced flexibility, muscular endurance and strength shown through measurements of functional capacity and personal statement of participant.          Exercise Goals Re-Evaluation :     Exercise Goals Re-Evaluation    Row Name 02/17/17 1247 03/11/17 1305           Exercise Goal Re-Evaluation   Exercise Goals Review Increase Physical Activity;Increase Strenth and Stamina Increase Physical Activity;Increase Strength and Stamina      Comments Patient has completed 18 sessions with increased strength, stamina, and activity. She is doing more around the house and has returned to work. She is progressing in treadmill speed and nustep level. Will continue to monitor.  Patient continues to progress with increased mets. She says her strength and stamina have improved. Will continue to monitor.       Expected Outcomes Patient will complete the program with continued increased strength, stamina, and activity. Patient will complete the program with continued increased strength, stamina, and activity.  Discharge Exercise Prescription (Final Exercise Prescription Changes):     Exercise Prescription Changes - 03/10/17 1400      Response to Exercise   Blood Pressure (Admit) 130/72   Blood Pressure (Exercise) 156/74   Blood Pressure (Exit) 134/72   Heart Rate (Admit) 77 bpm   Heart Rate (Exercise) 107 bpm   Heart Rate (Exit) 87 bpm   Rating of Perceived Exertion (Exercise) 13   Duration Progress  to 30 minutes of  aerobic without signs/symptoms of physical distress   Intensity THRR unchanged     Progression   Progression Continue to progress workloads to maintain intensity without signs/symptoms of physical distress.     Resistance Training   Training Prescription Yes   Weight 4   Reps 10-15     Treadmill   MPH 3   Grade 0   Minutes 20   METs 3.2     NuStep   Level 3   SPM 124   Minutes 15   METs 2.5     Home Exercise Plan   Plans to continue exercise at Home (comment)   Frequency Add 2 additional days to program exercise sessions.      Nutrition:  Target Goals: Understanding of nutrition guidelines, daily intake of sodium 1500mg , cholesterol 200mg , calories 30% from fat and 7% or less from saturated fats, daily to have 5 or more servings of fruits and vegetables.  Biometrics:     Pre Biometrics - 12/29/16 1501      Pre Biometrics   Height 5\' 6"  (1.676 m)   Waist Circumference 46.5 inches   Hip Circumference 49.5 inches   Waist to Hip Ratio 0.94 %   BMI (Calculated) 43.4   Triceps Skinfold 27 mm   % Body Fat 50.1 %   Grip Strength 49.8 kg   Flexibility 0 in   Single Leg Stand 2 seconds       Nutrition Therapy Plan and Nutrition Goals:     Nutrition Therapy & Goals - 03/11/17 1302      Nutrition Therapy   RD appointment defered Yes      Nutrition Discharge: Rate Your Plate Scores:     Nutrition Assessments - 12/29/16 1706      MEDFICTS Scores   Pre Score 27      Nutrition Goals Re-Evaluation:   Nutrition Goals Discharge (Final Nutrition Goals Re-Evaluation):   Psychosocial: Target Goals: Acknowledge presence or absence of significant depression and/or stress, maximize coping skills, provide positive support system. Participant is able to verbalize types and ability to use techniques and skills needed for reducing stress and depression.  Initial Review & Psychosocial Screening:     Initial Psych Review & Screening - 12/29/16  1707      Initial Review   Current issues with None Identified     Family Dynamics   Good Support System? Yes     Barriers   Psychosocial barriers to participate in program There are no identifiable barriers or psychosocial needs.     Screening Interventions   Interventions Encouraged to exercise      Quality of Life Scores:     Quality of Life - 12/29/16 1506      Quality of Life Scores   Health/Function Pre 15.6 %   Socioeconomic Pre 24.19 %   Psych/Spiritual Pre 28.29 %   Family Pre 21.6 %   GLOBAL Pre 20.96 %      PHQ-9: Recent Review Flowsheet Data    Depression screen  PHQ 2/9 12/29/2016   Decreased Interest 0   Down, Depressed, Hopeless 0   PHQ - 2 Score 0   Altered sleeping 2   Tired, decreased energy 2   Change in appetite 0   Feeling bad or failure about yourself  0   Trouble concentrating 0   Moving slowly or fidgety/restless 0   Suicidal thoughts 0   PHQ-9 Score 4   Difficult doing work/chores Not difficult at all     Interpretation of Total Score  Total Score Depression Severity:  1-4 = Minimal depression, 5-9 = Mild depression, 10-14 = Moderate depression, 15-19 = Moderately severe depression, 20-27 = Severe depression   Psychosocial Evaluation and Intervention:     Psychosocial Evaluation - 12/29/16 1707      Psychosocial Evaluation & Interventions   Interventions Encouraged to exercise with the program and follow exercise prescription   Continue Psychosocial Services  No Follow up required      Psychosocial Re-Evaluation:     Psychosocial Re-Evaluation    Row Name 02/17/17 1248 03/11/17 1306           Psychosocial Re-Evaluation   Current issues with None Identified None Identified      Expected Outcomes Patient will have no psychosocial issues identified at discharge.  Patient will have no psychosocial issues identified at discharge.       Interventions Encouraged to attend Cardiac Rehabilitation for the exercise;Stress  management education;Relaxation education Encouraged to attend Cardiac Rehabilitation for the exercise;Stress management education;Relaxation education      Continue Psychosocial Services  No Follow up required No Follow up required         Psychosocial Discharge (Final Psychosocial Re-Evaluation):     Psychosocial Re-Evaluation - 03/11/17 1306      Psychosocial Re-Evaluation   Current issues with None Identified   Expected Outcomes Patient will have no psychosocial issues identified at discharge.    Interventions Encouraged to attend Cardiac Rehabilitation for the exercise;Stress management education;Relaxation education   Continue Psychosocial Services  No Follow up required      Vocational Rehabilitation: Provide vocational rehab assistance to qualifying candidates.   Vocational Rehab Evaluation & Intervention:     Vocational Rehab - 12/29/16 1655      Initial Vocational Rehab Evaluation & Intervention   Assessment shows need for Vocational Rehabilitation No      Education: Education Goals: Education classes will be provided on a weekly basis, covering required topics. Participant will state understanding/return demonstration of topics presented.  Learning Barriers/Preferences:     Learning Barriers/Preferences - 12/29/16 1654      Learning Barriers/Preferences   Learning Barriers Hearing   Learning Preferences Written Material;Computer/Internet      Education Topics: Hypertension, Hypertension Reduction -Define heart disease and high blood pressure. Discus how high blood pressure affects the body and ways to reduce high blood pressure.   CARDIAC REHAB PHASE II EXERCISE from 03/10/2017 in [facility]  Date  02/10/17  Educator  DC      Exercise and Your Heart -Discuss why it is important to exercise, the FITT principles of exercise, normal and abnormal responses to exercise, and how to exercise safely.   CARDIAC REHAB PHASE II EXERCISE  from 03/10/2017 in [place] [facility]  Date  02/17/17  Educator  DC      Angina -Discuss definition of angina, causes of angina, treatment of angina, and how to decrease risk of having angina.   CARDIAC REHAB PHASE II EXERCISE from  03/10/2017 in [place] [facility]  Date  02/24/17  Educator  DC      Cardiac Medications -Review what the following cardiac medications are used for, how they affect the body, and side effects that may occur when taking the medications.  Medications include Aspirin, Beta blockers, calcium channel blockers, ACE Inhibitors, angiotensin receptor blockers, diuretics, digoxin, and antihyperlipidemics.   CARDIAC REHAB PHASE II EXERCISE from 03/10/2017 in [place] CARDIAC REHABILITATION  Date  03/03/17  Educator  DJ  Instruction Review Code  2- meets goals/outcomes      Congestive Heart Failure -Discuss the definition of CHF, how to live with CHF, the signs and symptoms of CHF, and how keep track of weight and sodium intake.   CARDIAC REHAB PHASE II EXERCISE from 03/10/2017 in [place] [facility]  Date  03/10/17  Educator  DC  Instruction Review Code  2- Demonstrated Understanding      Heart Disease and Intimacy -Discus the effect sexual activity has on the heart, how changes occur during intimacy as we age, and safety during sexual activity.   Smoking Cessation / COPD -Discuss different methods to quit smoking, the health benefits of quitting smoking, and the definition of COPD.   Nutrition I: Fats -Discuss the types of cholesterol, what cholesterol does to the heart, and how cholesterol levels can be controlled.   Nutrition II: Labels -Discuss the different components of food labels and how to read food label   CARDIAC REHAB PHASE II EXERCISE from 03/10/2017 in [facility] [facility]  Date  01/06/17  Educator  DC      Heart Parts and Heart Disease -Discuss the anatomy of the heart, the  pathway of blood circulation through the heart, and these are affected by heart disease.   Stress I: Signs and Symptoms -Discuss the causes of stress, how stress may lead to anxiety and depression, and ways to limit stress.   CARDIAC REHAB PHASE II EXERCISE from 03/10/2017 in [place] [facility]  Date  01/20/17  Educator  GC      Stress II: Relaxation -Discuss different types of relaxation techniques to limit stress.   CARDIAC REHAB PHASE II EXERCISE from 03/10/2017 in [place] [facility]  Date  01/27/17  Educator  GC      Warning Signs of Stroke / TIA -Discuss definition of a stroke, what the signs and symptoms are of a stroke, and how to identify when someone is having stroke.   CARDIAC REHAB PHASE II EXERCISE from 03/10/2017 in [facility] CARDIAC REHABILITATION  Date  02/03/17  Educator  DC      Knowledge Questionnaire Score:     Knowledge Questionnaire Score - 12/29/16 1654      Knowledge Questionnaire Score   Pre Score 24/24      Core Components/Risk Factors/Patient Goals at Admission:     Personal Goals and Risk Factors at Admission - 12/29/16 1659      Core Components/Risk Factors/Patient Goals on Admission    Weight Management Yes   Intervention Weight Management/Obesity: Establish reasonable short term and long term weight goals.   Admit Weight 268 lb 9.6 oz (121.8 kg)   Goal Weight: Short Term 258 lb 9.6 oz (117.3 kg)   Goal Weight: Long Term 248 lb 9.6 oz (112.8 kg)   Expected Outcomes Short Term: Continue to assess and modify interventions until short term weight is achieved;Long Term: Adherence to nutrition and physical activity/exercise program aimed toward attainment of established weight  goal   Personal Goal Other Yes   Personal Goal Lose 20lbs while in program and continue losing after the program to reach 50lbs or more. Gain strength and endurance.    Intervention Attend CR 3 x week and supplement with home exercise 2 x  week.    Expected Outcomes Reach personal goals.       Core Components/Risk Factors/Patient Goals Review:      Goals and Risk Factor Review    Row Name 12/29/16 1706 02/17/17 1245 03/11/17 1303         Core Components/Risk Factors/Patient Goals Review   Personal Goals Review Weight Management/Obesity Weight Management/Obesity  Gain strength and endurance.  Weight Management/Obesity  Weight loss; gain strength and endurance.      Review  - Patient has completed 18 sessions losing 4 lbs. She is doing well in the program stating she feels stronger and feels like she has more stamina. She is back to working fulltime without difficulty. Will continue to monitor.  Patient has completed 26 sessions losing 3 lbs. She continues to do well in the program. She says she does have more energy and feels better overall since she started the program. Will continue to monitor.      Expected Outcomes  - Patient will complete the program and continue to meet her personal goals.  Patient will complete the program meeting her personal goals.         Core Components/Risk Factors/Patient Goals at Discharge (Final Review):      Goals and Risk Factor Review - 03/11/17 1303      Core Components/Risk Factors/Patient Goals Review   Personal Goals Review Weight Management/Obesity  Weight loss; gain strength and endurance.    Review Patient has completed 26 sessions losing 3 lbs. She continues to do well in the program. She says she does have more energy and feels better overall since she started the program. Will continue to monitor.    Expected Outcomes Patient will complete the program meeting her personal goals.       ITP Comments:     ITP Comments    Row Name 12/29/16 1656 12/30/16 0733 01/18/17 0752       ITP Comments Mrs. Raggio is a 61 year old female that is starting our program. She has a hearing impediment. It will not keep her from exercising. she is in the process of getting hearing aides.   Patient new to program. Plans to start Monday 01/04/17. Patient new to program. She has completed 5 sessions. Will continue to monitor for progress.         Comments: ITP 30 Day REVIEW Patient doing well in the program. Will continue to monitor for progress.

## 2017-03-12 ENCOUNTER — Encounter (HOSPITAL_COMMUNITY)
Admission: RE | Admit: 2017-03-12 | Discharge: 2017-03-12 | Disposition: A | Discharge status: Home / Self Care | Payer: BLUE CROSS/BLUE SHIELD | Source: Ambulatory Visit | Attending: Cardiovascular Disease | Admitting: Cardiovascular Disease

## 2017-03-12 DIAGNOSIS — I213 ST elevation (STEMI) myocardial infarction of unspecified site: Secondary | ICD-10-CM

## 2017-03-12 DIAGNOSIS — Z955 Presence of coronary angioplasty implant and graft: Secondary | ICD-10-CM | POA: Diagnosis not present

## 2017-03-12 NOTE — Progress Notes (Signed)
Daily Session Note  Patient Details  Name: Felicia Silva MRN: [id number] Date of Birth: [birth year] Referring Provider:     CARDIAC REHAB PHASE II ORIENTATION from 12/29/2016 in [place]  Referring Provider  Dr. Burt Knack      Encounter Date: 03/12/2017  Check In:     Session Check In - 03/12/17 0835      Check-In   Location [facility]   Staff Present Russella Dar, MS, EP, [facility], Exercise Physiologist;Rogerick Baldwin Luther Parody, BS, EP, Exercise Physiologist   Supervising physician immediately available to respond to emergencies See telemetry face sheet for immediately available MD   Medication changes reported     No   Fall or balance concerns reported    No   Warm-up and Cool-down Performed as group-led instruction   Resistance Training Performed Yes   VAD Patient? No     Pain Assessment   Currently in Pain? No/denies   Pain Score 0-No pain   Multiple Pain Sites No      Capillary Blood Glucose: No results found for this or any previous visit (from the past 24 hour(s)).    History  Smoking Status  . Never Smoker  Smokeless Tobacco  . Never Used    Goals Met:  Independence with exercise equipment Exercise tolerated well No report of cardiac concerns or symptoms Strength training completed today  Goals Unmet:  Not Applicable  Comments: Check out 915   Dr. Kate Sable is Medical Director for [place] and Pulmonary Rehab.

## 2017-03-15 ENCOUNTER — Encounter (HOSPITAL_COMMUNITY): Payer: BLUE CROSS/BLUE SHIELD

## 2017-03-17 ENCOUNTER — Encounter (HOSPITAL_COMMUNITY)
Admission: RE | Admit: 2017-03-17 | Discharge: 2017-03-17 | Disposition: A | Discharge status: Home / Self Care | Payer: BLUE CROSS/BLUE SHIELD | Source: Ambulatory Visit | Attending: Cardiovascular Disease | Admitting: Cardiovascular Disease

## 2017-03-17 DIAGNOSIS — Z955 Presence of coronary angioplasty implant and graft: Secondary | ICD-10-CM | POA: Diagnosis not present

## 2017-03-17 DIAGNOSIS — I213 ST elevation (STEMI) myocardial infarction of unspecified site: Secondary | ICD-10-CM | POA: Insufficient documentation

## 2017-03-17 NOTE — Progress Notes (Signed)
Daily Session Note  Patient Details  Name: Felicia Silva MRN: [id number] Date of Birth: 01/03/1956 Referring Provider:     CARDIAC REHAB PHASE II ORIENTATION from 12/29/2016 in [place]  Referring Provider  Dr. Burt Knack      Encounter Date: 03/17/2017  Check In:     Session Check In - 03/17/17 0814      Check-In   Location [facility]   Staff Present Aundra Dubin, RN, BSN;Emmamae Mcnamara Luther Parody, BS, EP, Exercise Physiologist   Supervising physician immediately available to respond to emergencies See telemetry face sheet for immediately available MD   Medication changes reported     No   Fall or balance concerns reported    No   Warm-up and Cool-down Performed as group-led instruction   Resistance Training Performed Yes   VAD Patient? No     Pain Assessment   Currently in Pain? No/denies   Pain Score 0-No pain   Multiple Pain Sites No      Capillary Blood Glucose: No results found for this or any previous visit (from the past 24 hour(s)).    History  Smoking Status  . Never Smoker  Smokeless Tobacco  . Never Used    Goals Met:  Independence with exercise equipment Improved SOB with ADL's Using PLB without cueing & demonstrates good technique Exercise tolerated well No report of cardiac concerns or symptoms Strength training completed today  Goals Unmet:  Not Applicable  Comments: Check out 915   Dr. Kate Sable is Medical Director for [place] and Pulmonary Re[facility].

## 2017-03-19 ENCOUNTER — Encounter (HOSPITAL_COMMUNITY)
Admission: RE | Admit: 2017-03-19 | Discharge: 2017-03-19 | Disposition: A | Discharge status: Home / Self Care | Payer: BLUE CROSS/BLUE SHIELD | Source: Ambulatory Visit | Attending: Cardiovascular Disease | Admitting: Cardiovascular Disease

## 2017-03-19 DIAGNOSIS — I213 ST elevation (STEMI) myocardial infarction of unspecified site: Secondary | ICD-10-CM

## 2017-03-19 DIAGNOSIS — Z955 Presence of coronary angioplasty implant and graft: Secondary | ICD-10-CM | POA: Diagnosis not present

## 2017-03-19 NOTE — Progress Notes (Signed)
Daily Session Note  Patient Details  Name: Felicia Silva MRN: [id number] Date of Birth: October 15, 1955 Referring Provider:     CARDIAC REHAB PHASE II ORIENTATION from 12/29/2016 in [place]  Referring Provider  Dr. Burt Knack      Encounter Date: 03/19/2017  Check In:     Session Check In - 03/19/17 0811      Check-In   Location [facility]   Staff Present Aundra Dubin, RN, BSN;Dyron Kawano Luther Parody, BS, EP, Exercise Physiologist   Supervising physician immediately available to respond to emergencies See telemetry face sheet for immediately available MD   Medication changes reported     No   Fall or balance concerns reported    No   Warm-up and Cool-down Performed as group-led instruction   Resistance Training Performed Yes   VAD Patient? No     Pain Assessment   Currently in Pain? No/denies   Pain Score 0-No pain   Multiple Pain Sites No      Capillary Blood Glucose: No results found for this or any previous visit (from the past 24 hour(s)).    History  Smoking Status  . Never Smoker  Smokeless Tobacco  . Never Used    Goals Met:  Independence with exercise equipment Exercise tolerated well No report of cardiac concerns or symptoms Strength training completed today  Goals Unmet:  None  Comments: Check out 915   Dr. Kate Sable is Medical Director for [place] and Pulmonary Rehab.

## 2017-03-22 ENCOUNTER — Encounter (HOSPITAL_COMMUNITY)
Admission: RE | Admit: 2017-03-22 | Discharge: 2017-03-22 | Disposition: A | Discharge status: Home / Self Care | Payer: BLUE CROSS/BLUE SHIELD | Source: Ambulatory Visit | Attending: Cardiovascular Disease | Admitting: Cardiovascular Disease

## 2017-03-22 DIAGNOSIS — I213 ST elevation (STEMI) myocardial infarction of unspecified site: Secondary | ICD-10-CM

## 2017-03-22 DIAGNOSIS — Z955 Presence of coronary angioplasty implant and graft: Secondary | ICD-10-CM | POA: Diagnosis not present

## 2017-03-22 NOTE — Progress Notes (Signed)
Daily Session Note  Patient Details  Name: Felicia Silva MRN: [id number] Date of Birth: [birth year] Referring Provider:     CARDIAC REHAB PHASE II ORIENTATION from 12/29/2016 in [place]  Referring Provider  Dr. Burt Knack      Encounter Date: 03/22/2017  Check In:     Session Check In - 03/22/17 0832      Check-In   Location [facility]   Staff Present Aundra Dubin, RN, BSN;Danney Bungert Luther Parody, BS, EP, Exercise Physiologist   Supervising physician immediately available to respond to emergencies See telemetry face sheet for immediately available MD   Medication changes reported     No   Fall or balance concerns reported    No   Warm-up and Cool-down Performed as group-led instruction   Resistance Training Performed Yes   VAD Patient? No     Pain Assessment   Currently in Pain? No/denies   Pain Score 0-No pain   Multiple Pain Sites No      Capillary Blood Glucose: No results found for this or any previous visit (from the past 24 hour(s)).    History  Smoking Status  . Never Smoker  Smokeless Tobacco  . Never Used    Goals Met:  Independence with exercise equipment Exercise tolerated well No report of cardiac concerns or symptoms Strength training completed today  Goals Unmet:  Not Applicable  Comments: Check out 915   Dr. Kate Sable is Medical Director for [place] and Pulmonary Re[facility].

## 2017-03-24 ENCOUNTER — Encounter (HOSPITAL_COMMUNITY)
Admission: RE | Admit: 2017-03-24 | Discharge: 2017-03-24 | Disposition: A | Discharge status: Home / Self Care | Payer: BLUE CROSS/BLUE SHIELD | Source: Ambulatory Visit | Attending: Cardiovascular Disease | Admitting: Cardiovascular Disease

## 2017-03-24 DIAGNOSIS — I213 ST elevation (STEMI) myocardial infarction of unspecified site: Secondary | ICD-10-CM | POA: Diagnosis not present

## 2017-03-24 DIAGNOSIS — Z955 Presence of coronary angioplasty implant and graft: Secondary | ICD-10-CM | POA: Diagnosis not present

## 2017-03-24 NOTE — Progress Notes (Signed)
Daily Session Note  Patient Details  Name: Felicia Silva MRN: [id number] Date of Birth: [birth year] Referring Provider:     CARDIAC REHAB PHASE II ORIENTATION from 12/29/2016 in [place]  Referring Provider  Dr. Burt Knack      Encounter Date: 03/24/2017  Check In:     Session Check In - 03/24/17 0830      Check-In   Location [facility]   Staff Present Aundra Dubin, RN, BSN;Ayven Pheasant Luther Parody, BS, EP, Exercise Physiologist   Supervising physician immediately available to respond to emergencies See telemetry face sheet for immediately available MD   Medication changes reported     No   Fall or balance concerns reported    No   Warm-up and Cool-down Performed as group-led instruction   Resistance Training Performed Yes   VAD Patient? No     Pain Assessment   Currently in Pain? No/denies   Pain Score 0-No pain   Multiple Pain Sites No      Capillary Blood Glucose: No results found for this or any previous visit (from the past 24 hour(s)).    History  Smoking Status  . Never Smoker  Smokeless Tobacco  . Never Used    Goals Met:  Independence with exercise equipment Exercise tolerated well No report of cardiac concerns or symptoms Strength training completed today  Goals Unmet:  Not Applicable  Comments: Check out 915   Dr. Kate Sable is Medical Director for [place] and Pulmonary Rehab.

## 2017-03-26 ENCOUNTER — Encounter (HOSPITAL_COMMUNITY): Payer: BLUE CROSS/BLUE SHIELD

## 2017-03-31 ENCOUNTER — Encounter (HOSPITAL_COMMUNITY)
Admission: RE | Admit: 2017-03-31 | Discharge: 2017-03-31 | Disposition: A | Discharge status: Home / Self Care | Payer: BLUE CROSS/BLUE SHIELD | Source: Ambulatory Visit | Attending: Cardiovascular Disease | Admitting: Cardiovascular Disease

## 2017-03-31 DIAGNOSIS — I213 ST elevation (STEMI) myocardial infarction of unspecified site: Secondary | ICD-10-CM

## 2017-03-31 DIAGNOSIS — Z955 Presence of coronary angioplasty implant and graft: Secondary | ICD-10-CM | POA: Diagnosis not present

## 2017-03-31 NOTE — Progress Notes (Signed)
Daily Session Note  Patient Details  Name: ETHELYNE ERICH MRN: [id number] Date of Birth: 08-Aug-1955 Referring Provider:     CARDIAC REHAB PHASE II ORIENTATION from 12/29/2016 in [place]  Referring Provider  Dr. Burt Knack      Encounter Date: 03/31/2017  Check In:     Session Check In - 03/31/17 0839      Check-In   Location [facility]   Staff Present Aundra Dubin, RN, BSN;Artavis Cowie Luther Parody, BS, EP, Exercise Physiologist   Supervising physician immediately available to respond to emergencies See telemetry face sheet for immediately available MD   Medication changes reported     No   Fall or balance concerns reported    No   Warm-up and Cool-down Performed as group-led instruction   Resistance Training Performed Yes   VAD Patient? No     Pain Assessment   Currently in Pain? No/denies   Pain Score 0-No pain   Multiple Pain Sites No      Capillary Blood Glucose: No results found for this or any previous visit (from the past 24 hour(s)).    History  Smoking Status  . Never Smoker  Smokeless Tobacco  . Never Used    Goals Met:  Independence with exercise equipment Exercise tolerated well No report of cardiac concerns or symptoms Strength training completed today  Goals Unmet:  Not Applicable  Comments: Check out 915   Dr. Kate Sable is Medical Director for [place] and Pulmonary Rehab.

## 2017-04-01 ENCOUNTER — Ambulatory Visit (INDEPENDENT_AMBULATORY_CARE_PROVIDER_SITE_OTHER): Payer: BLUE CROSS/BLUE SHIELD | Admitting: Cardiovascular Disease

## 2017-04-01 ENCOUNTER — Encounter: Payer: Self-pay | Admitting: Cardiovascular Disease

## 2017-04-01 VITALS — BP 122/80 | HR 72 | Ht 66.5 in | Wt 264.4 lb

## 2017-04-01 DIAGNOSIS — E782 Mixed hyperlipidemia: Secondary | ICD-10-CM

## 2017-04-01 DIAGNOSIS — I2510 Atherosclerotic heart disease of native coronary artery without angina pectoris: Secondary | ICD-10-CM | POA: Diagnosis not present

## 2017-04-01 DIAGNOSIS — I10 Essential (primary) hypertension: Secondary | ICD-10-CM

## 2017-04-01 NOTE — Progress Notes (Signed)
Cardiology Office Note Date:  04/01/2017   ID:  Felicia Silva, Felicia Silva 03/18/1956, MRN [id number]  PCP:  Benita Stabile, MD  Cardiologist:  Tonny Bollman, MD    Chief Complaint  Patient presents with  . Follow-up    3-4 month follow up     History of Present Illness: Felicia Silva is a 61 y.o. female who presents for follow-up of coronary artery disease.  The patient initially presented in May 2018 with a STEMI involving the left circumflex. He was treated with a drug-eluting stent for primary PCI. Her hospitalization was uncomplicated and she was discharged home after 48 hours. She had moderate nonobstructive stenosis in the LAD with medical therapy recommended.  The patient is feeling well. Her endurance has improved as she is continued with cardiac rehabilitation. She denies any exertional chest pain or pressure. She denies leg swelling orthopnea, or PND. She's had no lightheadedness or syncope. She does complain of easy bruising.  Past Medical History:  Diagnosis Date  . CAD (coronary artery disease), native coronary artery    11/28/16 STEMI PCI DES--LCx, EF normal.   . Diabetes mellitus without complication (HCC)   . Hypertension   . Multiple thyroid nodules    on CT of chest  . STEMI (ST elevation myocardial infarction) [facility])     Past Surgical History:  Procedure Laterality Date  . ABDOMINAL SURGERY    . CHOLECYSTECTOMY    . CORONARY STENT INTERVENTION N/A 11/28/2016   Procedure: Coronary Stent Intervention;  Surgeon: Tonny Bollman, MD;  Location: [facility] INVASIVE CV LAB;  Service: Cardiovascular;  Laterality: N/A;  . HERNIA REPAIR    . LEFT HEART CATH AND CORONARY ANGIOGRAPHY N/A 11/28/2016   Procedure: Left Heart Cath and Coronary Angiography;  Surgeon: Tonny Bollman, MD;  Location: [facility] INVASIVE CV LAB;  Service: Cardiovascular;  Laterality: N/A;  . TONSILLECTOMY      Current Outpatient Prescriptions  Medication Sig Dispense Refill  . amLODipine (NORVASC) 5 MG tablet  Take 5 mg by mouth at bedtime.  0  . aspirin 81 MG chewable tablet Chew 81 mg by mouth daily.    Marland Kitchen atorvastatin (LIPITOR) 80 MG tablet Take 1 tablet (80 mg total) by mouth daily at 6 PM. 30 tablet 6  . glipiZIDE (GLUCOTROL) 10 MG tablet Take 1 tablet by mouth 2 (two) times daily.  0  . JARDIANCE 10 MG TABS tablet Take 10 mg by mouth daily.  0  . metFORMIN (GLUCOPHAGE) 500 MG tablet Take 1,000 mg by mouth 2 (two) times daily.   0  . metoprolol tartrate (LOPRESSOR) 25 MG tablet Take 1 tablet (25 mg total) by mouth 2 (two) times daily. 60 tablet 3  . nitroGLYCERIN (NITROSTAT) 0.4 MG SL tablet Place 1 tablet (0.4 mg total) under the tongue every 5 (five) minutes x 3 doses as needed for chest pain. 25 tablet 2  . olmesartan-hydrochlorothiazide (BENICAR HCT) 40-12.5 MG tablet Take 1 tablet by mouth daily.    . ticagrelor (BRILINTA) 90 MG TABS tablet Take 1 tablet (90 mg total) by mouth 2 (two) times daily. 60 tablet 10   No current facility-administered medications for this visit.     Allergies:   Shellfish allergy   Social History:  The patient  reports that she has never smoked. She has never used smokeless tobacco. She reports that she does not drink alcohol or use drugs.   Family History:  The patient's family history includes Heart attack (age of onset: 4)  in her father; Heart murmur in her mother; Pancreatic cancer in her mother.   ROS:  Please see the history of present illness.  All other systems are reviewed and negative.   PHYSICAL EXAM: VS:  BP 122/80   Pulse 72   Ht 5' 6.5" (1.689 m)   Wt 119.9 kg (264 lb 6.4 oz)   BMI 42.04 kg/m  , BMI Body mass index is 42.04 kg/m. GEN: Well nourished, well developed, pleasant obese woman in no acute distress  HEENT: normal  Neck: no JVD, no masses. No carotid bruits Cardiac: RRR without murmur or gallop                Respiratory:  clear to auscultation bilaterally, normal work of breathing GI: soft, nontender, nondistended, + BS MS: no  deformity or atrophy  Ext: no pretibial edema, pedal pulses 2+= bilaterally Skin: warm and dry, no rash Neuro:  Strength and sensation are intact Psych: euthymic mood, full affect  EKG:  EKG is not ordered today.  Recent Labs: 11/28/2016: B Natriuretic Peptide 69.6 11/29/2016: Hemoglobin 12.4; Platelets 291; TSH 1.965 12/04/2016: BUN 23; Creatinine, Ser 1.09; Potassium 4.3; Sodium 140 01/21/2017: ALT 28   Lipid Panel     Component Value Date/Time   CHOL 107 01/21/2017 1045   TRIG 124 01/21/2017 1045   HDL 37 (L) 01/21/2017 1045   CHOLHDL 2.9 01/21/2017 1045   CHOLHDL 3.9 11/29/2016 0528   VLDL 23 11/29/2016 0528   LDLCALC 45 01/21/2017 1045      Wt Readings from Last 3 Encounters:  04/01/17 119.9 kg (264 lb 6.4 oz)  12/29/16 121.8 kg (268 lb 9.6 oz)  12/09/16 124.3 kg (274 lb 1.9 oz)     Cardiac Studies Reviewed: Cardiac Cath 11/28/2016: Conclusion   1. Acute MI involving the left circumflex, treated successfully with primary PCI using a 3.0 x 16 mm Promus DES 2. Moderate nonobstructive stenosis of the mid LAD 3. Widely patent dominant RCA with minimal irregularities 4. Mild segmental contraction abnormality the left ventricle with preserved overall LV systolic function  Recommendations: Dual antiplatelet therapy with aspirin and brilinta 12 months as tolerated. If no immediate complications arise, anticipate hospital discharge Monday morning (48 hours). Initiate post MI medical therapy.   ASSESSMENT AND PLAN: 1.  CAD, native vessel, without angina: The patient seems to be progressing very well after her MI in May of this year. She is tolerating dual antiplatelet therapy with aspirin and Brilinta. Her medical program is reviewed and no changes are recommended today. I would like to see her back in May 2019 when she is up to 1 year from her infarct. I would plan on discontinuing Brilinta at that point. All of her questions are answered. She will contact me if she has any  problems in the interim.  2. Hypertension: Blood pressure is well controlled on amlodipine, metoprolol, and almost certain/HCTZ  3. Hyperlipidemia: Recent lipids are reviewed with the patient today. She will continue on atorvastatin 80 mg. Her cholesterol is excellent.  4. Type 2 diabetes: Treated with oral hypoglycemic agents. She is followed by her primary physician. Lifestyle modification is discussed today.  Current medicines are reviewed with the patient today.  The patient does not have concerns regarding medicines.  Labs/ tests ordered today include:  No orders of the defined types were placed in this encounter.   Disposition:   FU May 2019  Enzo Bi, MD  04/01/2017 5:42 PM    [facility] [place], [place], [place]  [id number] Phone: [phone]; Fax: [phone]

## 2017-04-01 NOTE — Patient Instructions (Signed)
Medication Instructions:  Your provider recommends that you continue on your current medications as directed. Please refer to the Current Medication list given to you today.    Labwork: None  Testing/Procedures: None  Follow-Up: Your provider wants you to follow-up in MAY, 2019 with Dr. Excell Seltzer. You will receive a reminder letter in the mail two months in advance. If you don't receive a letter, please call our office to schedule the follow-up appointment.    Any Other Special Instructions Will Be Listed Below (If Applicable).     If you need a refill on your cardiac medications before your next appointment, please call your pharmacy.

## 2017-04-02 ENCOUNTER — Encounter (HOSPITAL_COMMUNITY)
Admission: RE | Admit: 2017-04-02 | Discharge: 2017-04-02 | Disposition: A | Discharge status: Home / Self Care | Payer: BLUE CROSS/BLUE SHIELD | Source: Ambulatory Visit | Attending: Cardiovascular Disease | Admitting: Cardiovascular Disease

## 2017-04-02 DIAGNOSIS — I213 ST elevation (STEMI) myocardial infarction of unspecified site: Secondary | ICD-10-CM | POA: Diagnosis not present

## 2017-04-02 DIAGNOSIS — Z955 Presence of coronary angioplasty implant and graft: Secondary | ICD-10-CM | POA: Diagnosis not present

## 2017-04-02 NOTE — Progress Notes (Signed)
Daily Session Note  Patient Details  Name: Felicia Silva MRN: [id number] Date of Birth: [birth year] Referring Provider:     CARDIAC REHAB PHASE II ORIENTATION from 12/29/2016 in [place]  Referring Provider  Dr. Burt Knack      Encounter Date: 04/02/2017  Check In:     Session Check In - 04/02/17 0815      Check-In   Location [facility]   Staff Present Russella Dar, MS, EP, [facility], Exercise Physiologist;Debra Wynetta Emery, RN, BSN   Supervising physician immediately available to respond to emergencies See telemetry face sheet for immediately available MD   Medication changes reported     No   Fall or balance concerns reported    No   Tobacco Cessation No Change   Warm-up and Cool-down Performed as group-led instruction   Resistance Training Performed Yes   VAD Patient? No     Pain Assessment   Currently in Pain? No/denies   Pain Score 0-No pain   Multiple Pain Sites No      Capillary Blood Glucose: No results found for this or any previous visit (from the past 24 hour(s)).    History  Smoking Status  . Never Smoker  Smokeless Tobacco  . Never Used    Goals Met:  Independence with exercise equipment Exercise tolerated well No report of cardiac concerns or symptoms Strength training completed today  Goals Unmet:  Not Applicable  Comments: Check out 9:15   Dr. Kate Sable is Medical Director for [place] and Pulmonary Rehab.

## 2017-04-05 ENCOUNTER — Encounter (HOSPITAL_COMMUNITY)
Admission: RE | Admit: 2017-04-05 | Discharge: 2017-04-05 | Disposition: A | Discharge status: Home / Self Care | Payer: BLUE CROSS/BLUE SHIELD | Source: Ambulatory Visit | Attending: Cardiovascular Disease | Admitting: Cardiovascular Disease

## 2017-04-05 DIAGNOSIS — Z955 Presence of coronary angioplasty implant and graft: Secondary | ICD-10-CM | POA: Diagnosis not present

## 2017-04-05 DIAGNOSIS — I213 ST elevation (STEMI) myocardial infarction of unspecified site: Secondary | ICD-10-CM | POA: Diagnosis not present

## 2017-04-05 NOTE — Progress Notes (Signed)
Daily Session Note  Patient Details  Name: Felicia Silva MRN: [id number] Date of Birth: [birth year] Referring Provider:     CARDIAC REHAB PHASE II ORIENTATION from 12/29/2016 in [place]  Referring Provider  Dr. Burt Knack      Encounter Date: 04/05/2017  Check In:     Session Check In - 04/05/17 0833      Check-In   Location [facility]   Staff Present Aundra Dubin, RN, BSN;Vasili Fok Luther Parody, BS, EP, Exercise Physiologist   Supervising physician immediately available to respond to emergencies See telemetry face sheet for immediately available MD   Medication changes reported     No   Fall or balance concerns reported    No   Warm-up and Cool-down Performed as group-led instruction   Resistance Training Performed Yes   VAD Patient? No     Pain Assessment   Currently in Pain? No/denies   Pain Score 0-No pain   Multiple Pain Sites No      Capillary Blood Glucose: No results found for this or any previous visit (from the past 24 hour(s)).    History  Smoking Status  . Never Smoker  Smokeless Tobacco  . Never Used    Goals Met:  Independence with exercise equipment Exercise tolerated well No report of cardiac concerns or symptoms Strength training completed today  Goals Unmet:  Not Applicable  Comments: Check out 915   Dr. Kate Sable is Medical Director for [name] and Pulmonary Rehab.

## 2017-04-07 ENCOUNTER — Encounter (HOSPITAL_COMMUNITY)
Admission: RE | Admit: 2017-04-07 | Discharge: 2017-04-07 | Disposition: A | Discharge status: Home / Self Care | Payer: BLUE CROSS/BLUE SHIELD | Source: Ambulatory Visit | Attending: Cardiovascular Disease | Admitting: Cardiovascular Disease

## 2017-04-07 DIAGNOSIS — I213 ST elevation (STEMI) myocardial infarction of unspecified site: Secondary | ICD-10-CM

## 2017-04-07 DIAGNOSIS — Z955 Presence of coronary angioplasty implant and graft: Secondary | ICD-10-CM | POA: Diagnosis not present

## 2017-04-07 NOTE — Progress Notes (Signed)
Daily Session Note  Patient Details  Name: Felicia Silva MRN: [id number] Date of Birth: 13-Jan-1956 Referring Provider:     CARDIAC REHAB PHASE II ORIENTATION from 12/29/2016 in [place]  Referring Provider  Dr. Burt Knack      Encounter Date: 04/07/2017  Check In:     Session Check In - 04/07/17 0817      Check-In   Location [facility]   Staff Present Aundra Dubin, RN, BSN;Ammie Warrick Luther Parody, BS, EP, Exercise Physiologist   Supervising physician immediately available to respond to emergencies See telemetry face sheet for immediately available MD   Medication changes reported     No   Fall or balance concerns reported    No   Warm-up and Cool-down Performed as group-led instruction   Resistance Training Performed Yes   VAD Patient? No     Pain Assessment   Currently in Pain? No/denies   Pain Score 0-No pain   Multiple Pain Sites No      Capillary Blood Glucose: No results found for this or any previous visit (from the past 24 hour(s)).    History  Smoking Status  . Never Smoker  Smokeless Tobacco  . Never Used    Goals Met:  Independence with exercise equipment Exercise tolerated well No report of cardiac concerns or symptoms Strength training completed today  Goals Unmet:  Not Applicable  Comments: Check out 915   Dr. Kate Sable is Medical Director for [place] and Pulmonary Rehab.

## 2017-04-09 ENCOUNTER — Encounter (HOSPITAL_COMMUNITY)
Admission: RE | Admit: 2017-04-09 | Discharge: 2017-04-09 | Disposition: A | Discharge status: Home / Self Care | Payer: BLUE CROSS/BLUE SHIELD | Source: Ambulatory Visit | Attending: Cardiovascular Disease | Admitting: Cardiovascular Disease

## 2017-04-09 VITALS — Ht 66.0 in | Wt 265.7 lb

## 2017-04-09 DIAGNOSIS — I213 ST elevation (STEMI) myocardial infarction of unspecified site: Secondary | ICD-10-CM

## 2017-04-09 DIAGNOSIS — Z955 Presence of coronary angioplasty implant and graft: Secondary | ICD-10-CM | POA: Diagnosis not present

## 2017-04-09 NOTE — Progress Notes (Signed)
Cardiac Individual Treatment Plan  Patient Details  Name: Felicia Silva MRN: [id number] Date of Birth: 13-Oct-1957 Referring Provider:     CARDIAC REHAB PHASE II ORIENTATION from 12/30/2022 in [place]  Referring Provider  Dr. Burt Knack      Initial Encounter Date:    CARDIAC REHAB PHASE II ORIENTATION from 12/30/2022 in [place]  Date  12/29/16  Referring Provider  Dr. Burt Knack      Visit Diagnosis: ST elevation myocardial infarction (STEMI), unspecified artery [facility])  Status post coronary artery stent placement  Patient's Home Medications on Admission:  Current Outpatient Prescriptions:  .  amLODipine (NORVASC) 5 MG tablet, Take 5 mg by mouth at bedtime., Disp: , Rfl: 0 .  aspirin 81 MG chewable tablet, Chew 81 mg by mouth daily., Disp: , Rfl:  .  atorvastatin (LIPITOR) 80 MG tablet, Take 1 tablet (80 mg total) by mouth daily at 6 PM., Disp: 30 tablet, Rfl: 6 .  glipiZIDE (GLUCOTROL) 10 MG tablet, Take 1 tablet by mouth 2 (two) times daily., Disp: , Rfl: 0 .  JARDIANCE 10 MG TABS tablet, Take 10 mg by mouth daily., Disp: , Rfl: 0 .  metFORMIN (GLUCOPHAGE) 500 MG tablet, Take 1,000 mg by mouth 2 (two) times daily. , Disp: , Rfl: 0 .  metoprolol tartrate (LOPRESSOR) 25 MG tablet, Take 1 tablet (25 mg total) by mouth 2 (two) times daily., Disp: 60 tablet, Rfl: 3 .  nitroGLYCERIN (NITROSTAT) 0.4 MG SL tablet, Place 1 tablet (0.4 mg total) under the tongue every 5 (five) minutes x 3 doses as needed for chest pain., Disp: 25 tablet, Rfl: 2 .  olmesartan-hydrochlorothiazide (BENICAR HCT) 40-12.5 MG tablet, Take 1 tablet by mouth daily., Disp: , Rfl:  .  ticagrelor (BRILINTA) 90 MG TABS tablet, Take 1 tablet (90 mg total) by mouth 2 (two) times daily., Disp: 60 tablet, Rfl: 10  Past Medical History: Past Medical History:  Diagnosis Date  . CAD (coronary artery disease), native coronary artery    11/28/16 STEMI PCI DES--LCx, EF normal.   .  Diabetes mellitus without complication ([place])   . Hypertension   . Multiple thyroid nodules    on CT of chest  . STEMI (ST elevation myocardial infarction) ([place])     Tobacco Use: History  Smoking Status  . Never Smoker  Smokeless Tobacco  . Never Used    Labs: Recent Review Flowsheet Data    Labs for ITP Cardiac and Pulmonary Rehab Latest Ref Rng & Units 11/28/2016 11/29/2016 01/21/2017   Cholestrol 100 - 199 mg/dL 205(H) 158 107   LDLCALC 0 - 99 mg/dL 121(H) 94 45   HDL >39 mg/dL 48 41 37(L)   Trlycerides 0 - 149 mg/dL 181(H) 116 124   Hemoglobin A1c 4.8 - 5.6 % 8.8(H) - -      Capillary Blood Glucose: Lab Results  Component Value Date   GLUCAP 204 (H) 11/30/2016   GLUCAP 143 (H) 11/30/2016   GLUCAP 165 (H) 11/29/2016   GLUCAP 224 (H) 11/29/2016   GLUCAP 204 (H) 11/29/2016     Exercise Target Goals:    Exercise Program Goal: Individual exercise prescription set with THRR, safety & activity barriers. Participant demonstrates ability to understand and report RPE using BORG scale, to self-measure pulse accurately, and to acknowledge the importance of the exercise prescription.  Exercise Prescription Goal: Starting with aerobic activity 30 plus minutes a day, 3 days per week for initial exercise prescription. Provide home exercise  prescription and guidelines that participant acknowledges understanding prior to discharge.  Activity Barriers & Risk Stratification:     Activity Barriers & Cardiac Risk Stratification - 12/29/16 1706      Activity Barriers & Cardiac Risk Stratification   Activity Barriers None   Cardiac Risk Stratification High      6 Minute Walk:     6 Minute Walk    Row Name 12/29/16 1552 04/09/17 1410       6 Minute Walk   Phase Initial Discharge    Distance 1500 feet 1700 feet    Distance % Change 0 % 13.33 %    Distance Feet Change  - 200 ft    Walk Time 6 minutes 6 minutes    # of Rest Breaks 0 0    MPH 2.84 3.21    METS 3.17 3.46     RPE 11 12    Perceived Dyspnea  13 12    VO2 Peak 10.57 12.5    Symptoms No No    Resting HR 77 bpm 83 bpm    Resting BP 160/100 126/74    Resting Oxygen Saturation   - 98 %    Exercise Oxygen Saturation  during 6 min walk  - 97 %    Max Ex. HR 118 bpm 101 bpm    Max Ex. BP 120/80 164/80    2 Minute Post BP 126/82 128/74       Oxygen Initial Assessment:   Oxygen Re-Evaluation:   Oxygen Discharge (Final Oxygen Re-Evaluation):   Initial Exercise Prescription:     Initial Exercise Prescription - 12/29/16 1500      Date of Initial Exercise RX and Referring Provider   Date 12/29/16   Referring Provider Dr. Burt Knack     Treadmill   MPH 1.5   Grade 0   Minutes 15   METs 2.1     NuStep   Level 2   SPM 13   Minutes 20   METs 1.8     Prescription Details   Frequency (times per week) 3   Duration Progress to 30 minutes of continuous aerobic without signs/symptoms of physical distress     Intensity   THRR 40-80% of Max Heartrate [phone]   Ratings of Perceived Exertion 11-13   Perceived Dyspnea 0-4     Progression   Progression Continue progressive overload as per policy without signs/symptoms or physical distress.     Resistance Training   Training Prescription Yes   Weight 1   Reps 10-15      Perform Capillary Blood Glucose checks as needed.  Exercise Prescription Changes:      Exercise Prescription Changes    Row Name 01/12/17 0800 02/01/17 1400 02/23/17 0700 03/10/17 1400 03/31/17 1200     Response to Exercise   Blood Pressure (Admit) 130/64 134/76 130/70 130/72 128/74   Blood Pressure (Exercise) 120/70 180/100 154/80 156/74 140/78   Blood Pressure (Exit) 130/80 126/72 120/74 134/72 122/70   Heart Rate (Admit) 55 bpm 64 bpm 67 bpm 77 bpm 70 bpm   Heart Rate (Exercise) 109 bpm 111 bpm 100 bpm 107 bpm 108 bpm   Heart Rate (Exit) 66 bpm 83 bpm 77 bpm 87 bpm 78 bpm   Rating of Perceived Exertion (Exercise) '11 11 12 13 13   ' Duration Progress to 30  minutes of  aerobic without signs/symptoms of physical distress Progress to 30 minutes of  aerobic without signs/symptoms of physical distress Progress to 30 minutes  of  aerobic without signs/symptoms of physical distress Progress to 30 minutes of  aerobic without signs/symptoms of physical distress Progress to 30 minutes of  aerobic without signs/symptoms of physical distress   Intensity THRR unchanged THRR unchanged THRR unchanged THRR unchanged THRR unchanged     Progression   Progression Continue to progress workloads to maintain intensity without signs/symptoms of physical distress. Continue to progress workloads to maintain intensity without signs/symptoms of physical distress. Continue to progress workloads to maintain intensity without signs/symptoms of physical distress. Continue to progress workloads to maintain intensity without signs/symptoms of physical distress. Continue to progress workloads to maintain intensity without signs/symptoms of physical distress.     Resistance Training   Training Prescription Yes Yes Yes Yes Yes   Weight '1 3 3 4 4   ' Reps 10-15 10-15 10-15 10-15 10-15     Treadmill   MPH 2 2.5 2.'8 3 3   ' Grade 0 0 0 0 0   Minutes '15 20 20 20 20   ' METs 2.5 2.9 3.1 3.2 3.2     NuStep   Level '3 3 3 3 4   ' SPM 33 55 55 124 115   Minutes '20 15 15 15 15   ' METs 3.75 3.75 2.5 2.5 2.5     Home Exercise Plan   Plans to continue exercise at Home (comment) Home (comment) Home (comment) Home (comment) Home (comment)   Frequency Add 2 additional days to program exercise sessions. Add 2 additional days to program exercise sessions. Add 2 additional days to program exercise sessions. Add 2 additional days to program exercise sessions. Add 2 additional days to program exercise sessions.      Exercise Comments:      Exercise Comments    Row Name 01/12/17 5465 02/01/17 1440 02/23/17 0745 03/10/17 1413 03/31/17 1401   Exercise Comments Patient is doing well in CR.  Patient is  doing well in CR.  Patient is doing well in CR.  Patient is doing well in CR.  Patient is doing well in CR.       Exercise Goals and Review:      Exercise Goals    Row Name 12/29/16 1659             Exercise Goals   Increase Physical Activity Yes       Intervention Provide advice, education, support and counseling about physical activity/exercise needs.;Develop an individualized exercise prescription for aerobic and resistive training based on initial evaluation findings, risk stratification, comorbidities and participant's personal goals.       Expected Outcomes Achievement of increased cardiorespiratory fitness and enhanced flexibility, muscular endurance and strength shown through measurements of functional capacity and personal statement of participant.       Increase Strength and Stamina Yes       Intervention Provide advice, education, support and counseling about physical activity/exercise needs.;Develop an individualized exercise prescription for aerobic and resistive training based on initial evaluation findings, risk stratification, comorbidities and participant's personal goals.       Expected Outcomes Achievement of increased cardiorespiratory fitness and enhanced flexibility, muscular endurance and strength shown through measurements of functional capacity and personal statement of participant.          Exercise Goals Re-Evaluation :     Exercise Goals Re-Evaluation    Row Name 02/17/17 1247 03/11/17 1305 03/31/17 1401         Exercise Goal Re-Evaluation   Exercise Goals Review Increase Physical Activity;Increase Strenth and Stamina Increase  Physical Activity;Increase Strength and Stamina Increase Physical Activity;Increase Strength and Stamina;Knowledge and understanding of Target Heart Rate Range (THRR)     Comments Patient has completed 18 sessions with increased strength, stamina, and activity. She is doing more around the house and has returned to work. She is  progressing in treadmill speed and nustep level. Will continue to monitor.  Patient continues to progress with increased mets. She says her strength and stamina have improved. Will continue to monitor.  Patient is doing well in CR.      Expected Outcomes Patient will complete the program with continued increased strength, stamina, and activity. Patient will complete the program with continued increased strength, stamina, and activity. Patient wants to lose weight and to gain strength and endurance.          Discharge Exercise Prescription (Final Exercise Prescription Changes):     Exercise Prescription Changes - 03/31/17 1200      Response to Exercise   Blood Pressure (Admit) 128/74   Blood Pressure (Exercise) 140/78   Blood Pressure (Exit) 122/70   Heart Rate (Admit) 70 bpm   Heart Rate (Exercise) 108 bpm   Heart Rate (Exit) 78 bpm   Rating of Perceived Exertion (Exercise) 13   Duration Progress to 30 minutes of  aerobic without signs/symptoms of physical distress   Intensity THRR unchanged     Progression   Progression Continue to progress workloads to maintain intensity without signs/symptoms of physical distress.     Resistance Training   Training Prescription Yes   Weight 4   Reps 10-15     Treadmill   MPH 3   Grade 0   Minutes 20   METs 3.2     NuStep   Level 4   SPM 115   Minutes 15   METs 2.5     Home Exercise Plan   Plans to continue exercise at Home (comment)   Frequency Add 2 additional days to program exercise sessions.      Nutrition:  Target Goals: Understanding of nutrition guidelines, daily intake of sodium <1581m, cholesterol <2056m calories 30% from fat and 7% or less from saturated fats, daily to have 5 or more servings of fruits and vegetables.  Biometrics:     Pre Biometrics - 12/29/16 1501      Pre Biometrics   Height '5\' 6"'  (1.676 m)   Waist Circumference 46.5 inches   Hip Circumference 49.5 inches   Waist to Hip Ratio 0.94 %   BMI  (Calculated) 43.4   Triceps Skinfold 27 mm   % Body Fat 50.1 %   Grip Strength 49.8 kg   Flexibility 0 in   Single Leg Stand 2 seconds         Post Biometrics - 04/09/17 1411       Post  Biometrics   Height '5\' 6"'  (1.676 m)   Weight 265 lb 11.9 oz (120.5 kg)   Waist Circumference 47 inches   Hip Circumference 48 inches   Waist to Hip Ratio 0.98 %   BMI (Calculated) 42.91   Triceps Skinfold 26 mm   % Body Fat 49.9 %   Grip Strength 53.67 kg   Flexibility 0 in   Single Leg Stand 3 seconds      Nutrition Therapy Plan and Nutrition Goals:     Nutrition Therapy & Goals - 03/11/17 1302      Nutrition Therapy   RD appointment defered Yes      Nutrition Discharge: Rate  Your Plate Scores:     Nutrition Assessments - 04/09/17 1456      MEDFICTS Scores   Pre Score 27   Post Score 36   Score Difference 9      Nutrition Goals Re-Evaluation:   Nutrition Goals Discharge (Final Nutrition Goals Re-Evaluation):   Psychosocial: Target Goals: Acknowledge presence or absence of significant depression and/or stress, maximize coping skills, provide positive support system. Participant is able to verbalize types and ability to use techniques and skills needed for reducing stress and depression.  Initial Review & Psychosocial Screening:     Initial Psych Review & Screening - 12/29/16 1707      Initial Review   Current issues with None Identified     Family Dynamics   Good Support System? Yes     Barriers   Psychosocial barriers to participate in program There are no identifiable barriers or psychosocial needs.     Screening Interventions   Interventions Encouraged to exercise      Quality of Life Scores:     Quality of Life - 04/09/17 1412      Quality of Life Scores   Health/Function Pre 15.6 %   Health/Function Post 25.6 %   Health/Function % Change 64.1 %   Socioeconomic Pre 24.19 %   Socioeconomic Post 24.86 %   Socioeconomic % Change  2.77 %    Psych/Spiritual Pre 28.29 %   Psych/Spiritual Post 28.29 %   Psych/Spiritual % Change 0 %   Family Pre 21.6 %   Family Post 28.8 %   Family % Change 33.33 %   GLOBAL Pre 20.96 %   GLOBAL Post 26.47 %   GLOBAL % Change 26.29 %      PHQ-9: Recent Review Flowsheet Data    Depression screen [facility] 2/9 04/09/2017 12/29/2016   Decreased Interest 0 0   Down, Depressed, Hopeless 0 0   PHQ - 2 Score 0 0   Altered sleeping 0 2   Tired, decreased energy 2 2   Change in appetite 0 0   Feeling bad or failure about yourself  0 0   Trouble concentrating 0 0   Moving slowly or fidgety/restless 0 0   Suicidal thoughts 0 0   PHQ-9 Score 2 4   Difficult doing work/chores Not difficult at all Not difficult at all     Interpretation of Total Score  Total Score Depression Severity:  1-4 = Minimal depression, 5-9 = Mild depression, 10-14 = Moderate depression, 15-19 = Moderately severe depression, 20-27 = Severe depression   Psychosocial Evaluation and Intervention:     Psychosocial Evaluation - 04/09/17 1504      Discharge Psychosocial Assessment & Intervention   Comments Patient has no psychosocial issues identified at discharge. Her exit QOL score improved by 33.33% at 26.47. Her PHQ9 score also improved from 4 to 2.      Psychosocial Re-Evaluation:     Psychosocial Re-Evaluation    [place] Name 02/17/17 1248 03/11/17 1306           Psychosocial Re-Evaluation   Current issues with None Identified None Identified      Expected Outcomes Patient will have no psychosocial issues identified at discharge.  Patient will have no psychosocial issues identified at discharge.       Interventions Encouraged to attend Cardiac Rehabilitation for the exercise;Stress management education;Relaxation education Encouraged to attend Cardiac Rehabilitation for the exercise;Stress management education;Relaxation education      Continue Psychosocial Services  No  Follow up required No Follow up required          Psychosocial Discharge (Final Psychosocial Re-Evaluation):     Psychosocial Re-Evaluation - 03/11/17 1306      Psychosocial Re-Evaluation   Current issues with None Identified   Expected Outcomes Patient will have no psychosocial issues identified at discharge.    Interventions Encouraged to attend Cardiac Rehabilitation for the exercise;Stress management education;Relaxation education   Continue Psychosocial Services  No Follow up required      Vocational Rehabilitation: Provide vocational rehab assistance to qualifying candidates.   Vocational Rehab Evaluation & Intervention:     Vocational Rehab - 12/29/16 1655      Initial Vocational Rehab Evaluation & Intervention   Assessment shows need for Vocational Rehabilitation No      Education: Education Goals: Education classes will be provided on a weekly basis, covering required topics. Participant will state understanding/return demonstration of topics presented.  Learning Barriers/Preferences:     Learning Barriers/Preferences - 12/29/16 1654      Learning Barriers/Preferences   Learning Barriers Hearing   Learning Preferences Written Material;Computer/Internet      Education Topics: Hypertension, Hypertension Reduction -Define heart disease and high blood pressure. Discus how high blood pressure affects the body and ways to reduce high blood pressure.   CARDIAC REHAB PHASE II EXERCISE from 04/07/2017 in [place]  Date  02/10/17  Educator  DC      Exercise and Your Heart -Discuss why it is important to exercise, the FITT principles of exercise, normal and abnormal responses to exercise, and how to exercise safely.   CARDIAC REHAB PHASE II EXERCISE from 04/07/2017 in [place]  Date  02/17/17  Educator  DC      Angina -Discuss definition of angina, causes of angina, treatment of angina, and how to decrease risk of having angina.   CARDIAC REHAB PHASE II  EXERCISE from 04/07/2017 in [place]  Date  02/24/17  Educator  DC      Cardiac Medications -Review what the following cardiac medications are used for, how they affect the body, and side effects that may occur when taking the medications.  Medications include Aspirin, Beta blockers, calcium channel blockers, ACE Inhibitors, angiotensin receptor blockers, diuretics, digoxin, and antihyperlipidemics.   CARDIAC REHAB PHASE II EXERCISE from 04/07/2017 in [place]  Date  03/03/17  Educator  DJ  Instruction Review Code  2- meets goals/outcomes      Congestive Heart Failure -Discuss the definition of CHF, how to live with CHF, the signs and symptoms of CHF, and how keep track of weight and sodium intake.   CARDIAC REHAB PHASE II EXERCISE from 04/07/2017 in [place]  Date  03/10/17  Educator  DC  Instruction Review Code  2- Demonstrated Understanding      Heart Disease and Intimacy -Discus the effect sexual activity has on the heart, how changes occur during intimacy as we age, and safety during sexual activity.   CARDIAC REHAB PHASE II EXERCISE from 04/07/2017 in [place]  Date  03/17/17  Educator  DC  Instruction Review Code  2- Demonstrated Understanding      Smoking Cessation / COPD -Discuss different methods to quit smoking, the health benefits of quitting smoking, and the definition of COPD.   CARDIAC REHAB PHASE II EXERCISE from 04/07/2017 in [place]  Date  03/24/17  Educator  DJ  Instruction Review Code  2- Demonstrated Understanding      Nutrition I: Fats -Discuss the types of cholesterol, what cholesterol does to the heart, and how cholesterol levels can be controlled.   CARDIAC REHAB PHASE II EXERCISE from 04/07/2017 in [place]  Date  03/31/17  Educator  DC  Instruction Review Code  2- Demonstrated Understanding       Nutrition II: Labels -Discuss the different components of food labels and how to read food label   [place] from 04/07/2017 in [place]  Date  01/06/17  Educator  DC      Heart Parts and Heart Disease -Discuss the anatomy of the heart, the pathway of blood circulation through the heart, and these are affected by heart disease.   Stress I: Signs and Symptoms -Discuss the causes of stress, how stress may lead to anxiety and depression, and ways to limit stress.   CARDIAC REHAB PHASE II EXERCISE from 04/07/2017 in [place]  Date  01/20/17  Educator  GC      Stress II: Relaxation -Discuss different types of relaxation techniques to limit stress.   CARDIAC REHAB PHASE II EXERCISE from 04/07/2017 in [place]  Date  01/27/17  Educator  GC      Warning Signs of Stroke / TIA -Discuss definition of a stroke, what the signs and symptoms are of a stroke, and how to identify when someone is having stroke.   CARDIAC REHAB PHASE II EXERCISE from 04/07/2017 in [place]  Date  02/03/17  Educator  DC      Knowledge Questionnaire Score:     Knowledge Questionnaire Score - 04/09/17 1456      Knowledge Questionnaire Score   Pre Score 24/24   Post Score 23/24      Core Components/Risk Factors/Patient Goals at Admission:     Personal Goals and Risk Factors at Admission - 12/29/16 1659      Core Components/Risk Factors/Patient Goals on Admission    Weight Management Yes   Intervention Weight Management/Obesity: Establish reasonable short term and long term weight goals.   Admit Weight 268 lb 9.6 oz (121.8 kg)   Goal Weight: Short Term 258 lb 9.6 oz (117.3 kg)   Goal Weight: Long Term 248 lb 9.6 oz (112.8 kg)   Expected Outcomes Short Term: Continue to assess and modify interventions until short term weight is achieved;Long Term: Adherence to nutrition and  physical activity/exercise program aimed toward attainment of established weight goal   Personal Goal Other Yes   Personal Goal Lose 20lbs while in program and continue losing after the program to reach 50lbs or more. Gain strength and endurance.    Intervention Attend CR 3 x week and supplement with home exercise 2 x week.    Expected Outcomes Reach personal goals.       Core Components/Risk Factors/Patient Goals Review:      Goals and Risk Factor Review    Row Name 12/29/16 1706 02/17/17 1245 03/11/17 1303 04/09/17 1457       Core Components/Risk Factors/Patient Goals Review   Personal Goals Review Weight Management/Obesity Weight Management/Obesity  Gain strength and endurance.  Weight Management/Obesity  Weight loss; gain strength and endurance.  Weight Management/Obesity;Diabetes  Gain strength and endurance.     Review  - Patient has completed 18 sessions losing 4 lbs. She is doing well in the program stating she feels stronger and feels like she has more  stamina. She is back to working fulltime without difficulty. Will continue to monitor.  Patient has completed 26 sessions losing 3 lbs. She continues to do well in the program. She says she does have more energy and feels better overall since she started the program. Will continue to monitor.  Patient completed the program with 36 sessions losing 2.8 lbs. Her most recent HGB A1C was 8.8 11/28/16. Her Medficts score did not improve at discharge but increased by 9% but still heart healthy at 36. Her exit measurements improved in grip strength. Her exit walk test improved by 13.33%. She says the program made her stronger. She does not feel as weak not and most definately feels better than when she started. She is exercising now 5 times/week. She says she has learned about heart health and how to manage it. She says she did acheive her goals in the program. She is thinking about joining our forever fit maintenance program to continue  exercising.     Expected Outcomes  - Patient will complete the program and continue to meet her personal goals.  Patient will complete the program meeting her personal goals.  Patient will continue to exercise and eat a heart healthy diet with better DM control and weight mangement and will continue to get stronger. CR will f/u for 1 year.        Core Components/Risk Factors/Patient Goals at Discharge (Final Review):      Goals and Risk Factor Review - 04/09/17 1457      Core Components/Risk Factors/Patient Goals Review   Personal Goals Review Weight Management/Obesity;Diabetes  Gain strength and endurance.    Review Patient completed the program with 36 sessions losing 2.8 lbs. Her most recent HGB A1C was 8.8 11/28/16. Her Medficts score did not improve at discharge but increased by 9% but still heart healthy at 36. Her exit measurements improved in grip strength. Her exit walk test improved by 13.33%. She says the program made her stronger. She does not feel as weak not and most definately feels better than when she started. She is exercising now 5 times/week. She says she has learned about heart health and how to manage it. She says she did acheive her goals in the program. She is thinking about joining our forever fit maintenance program to continue exercising.    Expected Outcomes Patient will continue to exercise and eat a heart healthy diet with better DM control and weight mangement and will continue to get stronger. CR will f/u for 1 year.       ITP Comments:     ITP Comments    Row Name 12/29/16 1656 12/30/16 0733 01/18/17 0752       ITP Comments Mrs. Alber is a 61 year old female that is starting our program. She has a hearing impediment. It will not keep her from exercising. she is in the process of getting hearing aides.  Patient new to program. Plans to start Monday 01/04/17. Patient new to program. She has completed 5 sessions. Will continue to monitor for progress.          Comments: Patient graduated from [facility] today on 04/09/17 after completing 36 sessions. She achieved LTG of 30 minutes of aerobic exercise at Max Met level of 3.3. All patients vitals are WNL. Patient has met with dietician. Discharge instruction has been reviewed in detail and patient stated an understanding of material given. Patient is thinking about joining our forever fit maintenance program to continue exercising. Cardiac  Rehab staff will make f/u calls at 1 month, 6 months, and 1 year. Patient had no complaints of any abnormal S/S or pain on their exit visit.

## 2017-04-09 NOTE — Progress Notes (Signed)
Discharge Progress Report  Patient Details  Name: Felicia Silva MRN: [id number] Date of Birth: Dec 10, 1955 Referring Provider:     CARDIAC REHAB PHASE II ORIENTATION from 12/29/2016 in [place]  Referring Provider  Dr. Burt Knack       Number of Visits: 36  Reason for Discharge:  Patient reached a stable level of exercise. Patient independent in their exercise. Patient has met program and personal goals.  Smoking History:  History  Smoking Status  . Never Smoker  Smokeless Tobacco  . Never Used    Diagnosis:  ST elevation myocardial infarction (STEMI), unspecified artery ([place])  Status post coronary artery stent placement  ADL UCSD:   Initial Exercise Prescription:     Initial Exercise Prescription - 12/29/16 1500      Date of Initial Exercise RX and Referring Provider   Date 12/29/16   Referring Provider Dr. Burt Knack     Treadmill   MPH 1.5   Grade 0   Minutes 15   METs 2.1     NuStep   Level 2   SPM 13   Minutes 20   METs 1.8     Prescription Details   Frequency (times per week) 3   Duration Progress to 30 minutes of continuous aerobic without signs/symptoms of physical distress     Intensity   THRR 40-80% of Max Heartrate [phone]   Ratings of Perceived Exertion 11-13   Perceived Dyspnea 0-4     Progression   Progression Continue progressive overload as per policy without signs/symptoms or physical distress.     Resistance Training   Training Prescription Yes   Weight 1   Reps 10-15      Discharge Exercise Prescription (Final Exercise Prescription Changes):     Exercise Prescription Changes - 03/31/17 1200      Response to Exercise   Blood Pressure (Admit) 128/74   Blood Pressure (Exercise) 140/78   Blood Pressure (Exit) 122/70   Heart Rate (Admit) 70 bpm   Heart Rate (Exercise) 108 bpm   Heart Rate (Exit) 78 bpm   Rating of Perceived Exertion (Exercise) 13   Duration Progress to 30 minutes of  aerobic  without signs/symptoms of physical distress   Intensity THRR unchanged     Progression   Progression Continue to progress workloads to maintain intensity without signs/symptoms of physical distress.     Resistance Training   Training Prescription Yes   Weight 4   Reps 10-15     Treadmill   MPH 3   Grade 0   Minutes 20   METs 3.2     NuStep   Level 4   SPM 115   Minutes 15   METs 2.5     Home Exercise Plan   Plans to continue exercise at Home (comment)   Frequency Add 2 additional days to program exercise sessions.      Functional Capacity:     6 Minute Walk    Row Name 12/29/16 1552 04/09/17 1410       6 Minute Walk   Phase Initial Discharge    Distance 1500 feet 1700 feet    Distance % Change 0 % 13.33 %    Distance Feet Change  - 200 ft    Walk Time 6 minutes 6 minutes    # of Rest Breaks 0 0    MPH 2.84 3.21    METS 3.17 3.46    RPE 11 12    Perceived Dyspnea  13 12    VO2 Peak 10.57 12.5    Symptoms No No    Resting HR 77 bpm 83 bpm    Resting BP 160/100 126/74    Resting Oxygen Saturation   - 98 %    Exercise Oxygen Saturation  during 6 min walk  - 97 %    Max Ex. HR 118 bpm 101 bpm    Max Ex. BP 120/80 164/80    2 Minute Post BP 126/82 128/74       Psychological, QOL, Others - Outcomes: PHQ 2/9: Depression screen [facility] 2/9 04/09/2017 12/29/2016  Decreased Interest 0 0  Down, Depressed, Hopeless 0 0  PHQ - 2 Score 0 0  Altered sleeping 0 2  Tired, decreased energy 2 2  Change in appetite 0 0  Feeling bad or failure about yourself  0 0  Trouble concentrating 0 0  Moving slowly or fidgety/restless 0 0  Suicidal thoughts 0 0  PHQ-9 Score 2 4  Difficult doing work/chores Not difficult at all Not difficult at all    Quality of Life:     Quality of Life - 04/09/17 1412      Quality of Life Scores   Health/Function Pre 15.6 %   Health/Function Post 25.6 %   Health/Function % Change 64.1 %   Socioeconomic Pre 24.19 %   Socioeconomic Post  24.86 %   Socioeconomic % Change  2.77 %   Psych/Spiritual Pre 28.29 %   Psych/Spiritual Post 28.29 %   Psych/Spiritual % Change 0 %   Family Pre 21.6 %   Family Post 28.8 %   Family % Change 33.33 %   GLOBAL Pre 20.96 %   GLOBAL Post 26.47 %   GLOBAL % Change 26.29 %      Personal Goals: Goals established at orientation with interventions provided to work toward goal.     Personal Goals and Risk Factors at Admission - 12/29/16 1659      Core Components/Risk Factors/Patient Goals on Admission    Weight Management Yes   Intervention Weight Management/Obesity: Establish reasonable short term and long term weight goals.   Admit Weight 268 lb 9.6 oz (121.8 kg)   Goal Weight: Short Term 258 lb 9.6 oz (117.3 kg)   Goal Weight: Long Term 248 lb 9.6 oz (112.8 kg)   Expected Outcomes Short Term: Continue to assess and modify interventions until short term weight is achieved;Long Term: Adherence to nutrition and physical activity/exercise program aimed toward attainment of established weight goal   Personal Goal Other Yes   Personal Goal Lose 20lbs while in program and continue losing after the program to reach 50lbs or more. Gain strength and endurance.    Intervention Attend CR 3 x week and supplement with home exercise 2 x week.    Expected Outcomes Reach personal goals.        Personal Goals Discharge:     Goals and Risk Factor Review    Row Name 12/29/16 1706 02/17/17 1245 03/11/17 1303 04/09/17 1457       Core Components/Risk Factors/Patient Goals Review   Personal Goals Review Weight Management/Obesity Weight Management/Obesity  Gain strength and endurance.  Weight Management/Obesity  Weight loss; gain strength and endurance.  Weight Management/Obesity;Diabetes  Gain strength and endurance.     Review  - Patient has completed 18 sessions losing 4 lbs. She is doing well in the program stating she feels stronger and feels like she has more stamina. She is back  to working  fulltime without difficulty. Will continue to monitor.  Patient has completed 26 sessions losing 3 lbs. She continues to do well in the program. She says she does have more energy and feels better overall since she started the program. Will continue to monitor.  Patient completed the program with 36 sessions losing 2.8 lbs. Her most recent HGB A1C was 8.8 11/28/16. Her Medficts score did not improve at discharge but increased by 9% but still heart healthy at 36. Her exit measurements improved in grip strength. Her exit walk test improved by 13.33%. She says the program made her stronger. She does not feel as weak not and most definately feels better than when she started. She is exercising now 5 times/week. She says she has learned about heart health and how to manage it. She says she did acheive her goals in the program. She is thinking about joining our forever fit maintenance program to continue exercising.     Expected Outcomes  - Patient will complete the program and continue to meet her personal goals.  Patient will complete the program meeting her personal goals.  Patient will continue to exercise and eat a heart healthy diet with better DM control and weight mangement and will continue to get stronger. CR will f/u for 1 year.        Exercise Goals and Review:     Exercise Goals    Row Name 12/29/16 1659             Exercise Goals   Increase Physical Activity Yes       Intervention Provide advice, education, support and counseling about physical activity/exercise needs.;Develop an individualized exercise prescription for aerobic and resistive training based on initial evaluation findings, risk stratification, comorbidities and participant's personal goals.       Expected Outcomes Achievement of increased cardiorespiratory fitness and enhanced flexibility, muscular endurance and strength shown through measurements of functional capacity and personal statement of participant.       Increase  Strength and Stamina Yes       Intervention Provide advice, education, support and counseling about physical activity/exercise needs.;Develop an individualized exercise prescription for aerobic and resistive training based on initial evaluation findings, risk stratification, comorbidities and participant's personal goals.       Expected Outcomes Achievement of increased cardiorespiratory fitness and enhanced flexibility, muscular endurance and strength shown through measurements of functional capacity and personal statement of participant.          Nutrition & Weight - Outcomes:     Pre Biometrics - 12/29/16 1501      Pre Biometrics   Height '5\' 6"'  (1.676 m)   Waist Circumference 46.5 inches   Hip Circumference 49.5 inches   Waist to Hip Ratio 0.94 %   BMI (Calculated) 43.4   Triceps Skinfold 27 mm   % Body Fat 50.1 %   Grip Strength 49.8 kg   Flexibility 0 in   Single Leg Stand 2 seconds         Post Biometrics - 04/09/17 1411       Post  Biometrics   Height '5\' 6"'  (1.676 m)   Weight 265 lb 11.9 oz (120.5 kg)   Waist Circumference 47 inches   Hip Circumference 48 inches   Waist to Hip Ratio 0.98 %   BMI (Calculated) 42.91   Triceps Skinfold 26 mm   % Body Fat 49.9 %   Grip Strength 53.67 kg   Flexibility 0 in  Single Leg Stand 3 seconds      Nutrition:     Nutrition Therapy & Goals - 03/11/17 1302      Nutrition Therapy   RD appointment defered Yes      Nutrition Discharge:     Nutrition Assessments - 04/09/17 1456      MEDFICTS Scores   Pre Score 27   Post Score 36   Score Difference 9      Education Questionnaire Score:     Knowledge Questionnaire Score - 04/09/17 1456      Knowledge Questionnaire Score   Pre Score 24/24   Post Score 23/24      Goals reviewed with patient; copy given to patient.

## 2017-04-09 NOTE — Progress Notes (Signed)
Daily Session Note  Patient Details  Name: Felicia Silva MRN: [id number] Date of Birth: 07-14-1955 Referring Provider:     CARDIAC REHAB PHASE II ORIENTATION from 12/29/2016 in [place]  Referring Provider  Dr. Burt Knack      Encounter Date: 04/09/2017  Check In:     Session Check In - 04/09/17 0826      Check-In   Location [facility]   Staff Present Aundra Dubin, RN, BSN;Kolton Kienle Luther Parody, BS, EP, Exercise Physiologist   Supervising physician immediately available to respond to emergencies See telemetry face sheet for immediately available MD   Medication changes reported     No   Fall or balance concerns reported    No   Warm-up and Cool-down Performed as group-led instruction   Resistance Training Performed Yes   VAD Patient? No     Pain Assessment   Currently in Pain? No/denies   Pain Score 0-No pain   Multiple Pain Sites No      Capillary Blood Glucose: No results found for this or any previous visit (from the past 24 hour(s)).    History  Smoking Status  . Never Smoker  Smokeless Tobacco  . Never Used    Goals Met:  Independence with exercise equipment Exercise tolerated well No report of cardiac concerns or symptoms Strength training completed today  Goals Unmet:  Not Applicable  Comments: Check out 915   Dr. Kate Sable is Medical Director for [place] and Pulmonary Rehab.

## 2017-04-12 ENCOUNTER — Encounter (HOSPITAL_COMMUNITY): Payer: BLUE CROSS/BLUE SHIELD

## 2017-08-02 DIAGNOSIS — J069 Acute upper respiratory infection, unspecified: Secondary | ICD-10-CM | POA: Diagnosis not present

## 2017-08-09 ENCOUNTER — Other Ambulatory Visit: Payer: Self-pay | Admitting: Cardiovascular Disease

## 2017-08-09 MED ORDER — TICAGRELOR 90 MG PO TABS: 90.0000 mg | ORAL_TABLET | Freq: Two times a day (BID) | ORAL | 8 refills | Status: DC

## 2017-10-26 DIAGNOSIS — Z6841 Body Mass Index (BMI) 40.0 and over, adult: Secondary | ICD-10-CM | POA: Diagnosis not present

## 2017-10-26 DIAGNOSIS — Z713 Dietary counseling and surveillance: Secondary | ICD-10-CM | POA: Diagnosis not present

## 2017-11-11 DIAGNOSIS — I10 Essential (primary) hypertension: Secondary | ICD-10-CM | POA: Diagnosis not present

## 2017-11-11 DIAGNOSIS — E119 Type 2 diabetes mellitus without complications: Secondary | ICD-10-CM | POA: Diagnosis not present

## 2017-11-11 DIAGNOSIS — I2510 Atherosclerotic heart disease of native coronary artery without angina pectoris: Secondary | ICD-10-CM | POA: Diagnosis not present

## 2017-11-15 ENCOUNTER — Other Ambulatory Visit: Payer: Self-pay | Admitting: General Surgery

## 2017-11-15 DIAGNOSIS — Z1231 Encounter for screening mammogram for malignant neoplasm of breast: Secondary | ICD-10-CM

## 2017-11-17 ENCOUNTER — Ambulatory Visit
Admission: RE | Admit: 2017-11-17 | Discharge: 2017-11-17 | Disposition: A | Discharge status: Home / Self Care | Payer: BLUE CROSS/BLUE SHIELD | Source: Ambulatory Visit | Attending: General Surgery | Admitting: General Surgery

## 2017-11-17 DIAGNOSIS — Z1231 Encounter for screening mammogram for malignant neoplasm of breast: Secondary | ICD-10-CM

## 2017-11-19 ENCOUNTER — Encounter (INDEPENDENT_AMBULATORY_CARE_PROVIDER_SITE_OTHER): Payer: Self-pay | Admitting: *Deleted

## 2017-11-19 ENCOUNTER — Telehealth: Payer: Self-pay | Admitting: Cardiovascular Disease

## 2017-11-19 NOTE — Telephone Encounter (Signed)
   [facility] Pre-operative Risk Assessment    Request for surgical clearance:  1. 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 When is this surgery scheduled?  TBD   3. What type of clearance is required (medical clearance vs. Pharmacy clearance to hold med vs. Both)?  Both  4. Are there any medications that need to be held prior to surgery and how long? Brilinta and ASA    5. Practice name and name of physician performing surgery?  Dr. Excell Seltzer with [facility]   6. What is your office phone number? [phone]    7.   What is your office fax number? Fowler, RMA  8.   Anesthesia type (None, local, MAC, general) ? Not listed    _________________________________________________________________   (provider comments below)

## 2017-11-23 NOTE — Telephone Encounter (Signed)
Ok to stop brilinta after 12 months. Agree she should have outpatient FU for clearance. She would be at lower cardiac risk if able to stay on ASA 81 mg daily in perioperative period, but if bleeding risk is prohibitive, ok to hold 5-7 days prior to surgery.

## 2017-11-23 NOTE — Telephone Encounter (Signed)
   Primary Cardiologist:Michael Excell Seltzer, MD  Chart reviewed as part of pre-operative protocol coverage. Because of Felicia Silva's past medical history and time since last visit, he/she will require a follow-up visit in order to better assess preoperative cardiovascular risk.  Pre-op covering staff: - Please schedule appointment and call patient to inform them. - Please contact requesting surgeon's office via preferred method (i.e, phone, fax) to inform them of need for appointment prior to surgery.   She has an appt with Dr. Excell Seltzer 12/2017. Can we see if we can move this up, possibly with an APP to get her cleared for surgery? I will also send a note to Dr. Excell Seltzer confirming that she can stop brilinta after 12 months. I will also ask him to comment on holding ASA for surgery.   [name], PA  11/23/2017, 4:26 PM

## 2017-11-24 NOTE — Telephone Encounter (Signed)
Left message for patient to call back  

## 2017-11-25 NOTE — Telephone Encounter (Signed)
Pt calling:   Returning call concerning her pre op. Please call pt.

## 2017-11-25 NOTE — Telephone Encounter (Signed)
Rescheduled patient to 5/31 (from 6/7) for preop clearance with Dr. Excell Seltzer.  She was grateful for call and agrees with treatment plan.

## 2017-11-29 ENCOUNTER — Other Ambulatory Visit (HOSPITAL_COMMUNITY): Payer: Self-pay | Admitting: General Surgery

## 2017-12-02 ENCOUNTER — Other Ambulatory Visit (HOSPITAL_COMMUNITY): Payer: Self-pay | Admitting: Radiology

## 2017-12-02 ENCOUNTER — Telehealth: Payer: Self-pay | Admitting: *Deleted

## 2017-12-02 ENCOUNTER — Ambulatory Visit (HOSPITAL_COMMUNITY)
Admission: RE | Admit: 2017-12-02 | Discharge: 2017-12-02 | Disposition: A | Discharge status: Home / Self Care | Payer: BLUE CROSS/BLUE SHIELD | Source: Ambulatory Visit | Attending: General Surgery | Admitting: General Surgery

## 2017-12-02 DIAGNOSIS — R9431 Abnormal electrocardiogram [ECG] [EKG]: Secondary | ICD-10-CM | POA: Diagnosis not present

## 2017-12-02 DIAGNOSIS — Z6841 Body Mass Index (BMI) 40.0 and over, adult: Secondary | ICD-10-CM | POA: Insufficient documentation

## 2017-12-02 DIAGNOSIS — Z01818 Encounter for other preprocedural examination: Secondary | ICD-10-CM | POA: Diagnosis not present

## 2017-12-02 NOTE — Telephone Encounter (Signed)
Patient presented to the office requesting brilinta samples. She states that Dr Excell Seltzer informed her at her last visit that he may be discontinuing this medication when she comes in this month. Samples provided.

## 2017-12-10 ENCOUNTER — Encounter (INDEPENDENT_AMBULATORY_CARE_PROVIDER_SITE_OTHER): Payer: Self-pay

## 2017-12-10 ENCOUNTER — Encounter: Payer: Self-pay | Admitting: Cardiovascular Disease

## 2017-12-10 ENCOUNTER — Ambulatory Visit (INDEPENDENT_AMBULATORY_CARE_PROVIDER_SITE_OTHER): Payer: BLUE CROSS/BLUE SHIELD | Admitting: Cardiovascular Disease

## 2017-12-10 VITALS — BP 138/90 | HR 84 | Ht 66.0 in | Wt 278.2 lb

## 2017-12-10 DIAGNOSIS — I2510 Atherosclerotic heart disease of native coronary artery without angina pectoris: Secondary | ICD-10-CM

## 2017-12-10 DIAGNOSIS — Z01810 Encounter for preprocedural cardiovascular examination: Secondary | ICD-10-CM

## 2017-12-10 DIAGNOSIS — I10 Essential (primary) hypertension: Secondary | ICD-10-CM

## 2017-12-10 NOTE — Progress Notes (Signed)
Cardiology Office Note Date:  12/10/2017   ID:  Felicia Silva, MRN [id number]  PCP:  Felicia StabileHall, John Z, MD  Cardiologist:  Tonny BollmanMichael Farran Amsden, MD    Chief Complaint  Patient presents with  . Coronary Artery Disease   History of Present Illness: Felicia GlassBrenda K Silva is a 62 y.o. female who presents for preoperative cardiovascular assessment.  The patient has been followed for coronary artery disease after initially presenting with a STEMI in May 2018 involving the left circumflex.  She was treated with a drug-eluting stent at that time and had an uncomplicated hospital course.  She is noted to have moderate nonobstructive stenosis in the LAD with medical therapy recommended for treatment.  LVEF at the time of her MI is estimated at 55 to 60%.  The patient is doing well.  She is in the process of evaluation for bariatric surgery.  She presents today for preoperative assessment.  She denies chest pain, shortness of breath, leg swelling, or heart palpitations.  She reports no problems with her medications.  She is continuing to do her best at lifestyle modification with diet and exercise.  Past Medical History:  Diagnosis Date  . CAD (coronary artery disease), native coronary artery    11/28/16 STEMI PCI DES--LCx, EF normal.   . Diabetes mellitus without complication (HCC)   . Hypertension   . Multiple thyroid nodules    on CT of chest  . STEMI (ST elevation myocardial infarction) [facility](HCC)     Past Surgical History:  Procedure Laterality Date  . ABDOMINAL SURGERY    . CHOLECYSTECTOMY    . CORONARY STENT INTERVENTION N/A 11/28/2016   Procedure: Coronary Stent Intervention;  Surgeon: Tonny Bollmanooper, Myran Arcia, MD;  [facility];  Service: Cardiovascular;  Laterality: N/A;  . HERNIA REPAIR    . LEFT HEART CATH AND CORONARY ANGIOGRAPHY N/A 11/28/2016   Procedure: Left Heart Cath and Coronary Angiography;  Surgeon: Tonny Bollmanooper, Delmer Kowalski, MD;  [facility];  Service:  Cardiovascular;  Laterality: N/A;  . TONSILLECTOMY      Current Outpatient Medications  Medication Sig Dispense Refill  . amLODipine (NORVASC) 5 MG tablet Take 5 mg by mouth at bedtime.  0  . aspirin 81 MG chewable tablet Chew 81 mg by mouth daily.    Marland Kitchen. atorvastatin (LIPITOR) 80 MG tablet Take 1 tablet (80 mg total) by mouth daily at 6 PM. 30 tablet 6  . glipiZIDE (GLUCOTROL) 10 MG tablet Take 1 tablet by mouth 2 (two) times daily.  0  . JARDIANCE 10 MG TABS tablet Take 10 mg by mouth daily.  0  . metFORMIN (GLUCOPHAGE) 500 MG tablet Take 1,000 mg by mouth 2 (two) times daily.   0  . metoprolol tartrate (LOPRESSOR) 25 MG tablet Take 1 tablet (25 mg total) by mouth 2 (two) times daily. 60 tablet 3  . nitroGLYCERIN (NITROSTAT) 0.4 MG SL tablet Place 1 tablet (0.4 mg total) under the tongue every 5 (five) minutes x 3 doses as needed for chest pain. 25 tablet 2  . olmesartan-hydrochlorothiazide (BENICAR HCT) 40-12.5 MG tablet Take 1 tablet by mouth daily.     No current facility-administered medications for this visit.     Allergies:   Shellfish allergy   Social History:  The patient  reports that she has never smoked. She has never used smokeless tobacco. She reports that she does not drink alcohol or use drugs.   Family History:  The patient's family history  includes Heart attack (age of onset: 14) in her father; Heart murmur in her mother; Pancreatic cancer in her mother.    ROS:  Please see the history of present illness.  Otherwise, review of systems is positive for dizziness, difficulty urinating, easy bruising.  All other systems are reviewed and negative.    PHYSICAL EXAM: VS:  BP 138/90   Pulse 84   Ht 5\' 6"  (1.676 m)   Wt 278 lb 3.2 oz (126.2 kg)   SpO2 99%   BMI 44.90 kg/m  , BMI Body mass index is 44.9 kg/m. GEN: pleasant, obese woman, in no acute distress  HEENT: normal  Neck: no JVD, no masses. No carotid bruits Cardiac: RRR without murmur or gallop                  Respiratory:  clear to auscultation bilaterally, normal work of breathing GI: soft, nontender, nondistended, + BS MS: no deformity or atrophy  Ext: no pretibial edema, pedal pulses 2+= bilaterally Skin: warm and dry, no rash Neuro:  Strength and sensation are intact Psych: euthymic mood, full affect  EKG:  EKG is not ordered today. EKG from 12/02/2017 shows normal sinus rhythm with age-indeterminate inferior and anteroseptal infarct, unchanged from her previous tracing 1 year ago.  Recent Labs: 01/21/2017: ALT 28   Lipid Panel     Component Value Date/Time   CHOL 107 01/21/2017 1045   TRIG 124 01/21/2017 1045   HDL 37 (L) 01/21/2017 1045   CHOLHDL 2.9 01/21/2017 1045   CHOLHDL 3.9 11/29/2016 0528   VLDL 23 11/29/2016 0528   LDLCALC 45 01/21/2017 1045      Wt Readings from Last 3 Encounters:  12/10/17 278 lb 3.2 oz (126.2 kg)  04/09/17 265 lb 11.9 oz (120.5 kg)  04/01/17 264 lb 6.4 oz (119.9 kg)     ASSESSMENT AND PLAN: 1.  Coronary artery disease, native vessel, without angina: The patient is doing well.  She is now 12 months out from her acute MI.  I recommended that she stop ticagrelor.  She should remain on aspirin lifelong.  Continue secondary risk reduction measures.  2.  Hypertension: Blood pressure control is borderline on a combination of metoprolol, olmesartan, amlodipine, and hydrochlorothiazide.  3.  Hyperlipidemia: Treated with atorvastatin 80 mg.  Due for follow-up lipids and LFTs.  4.  Type 2 diabetes: Treated by her primary care physician.  Diet and lifestyle modification reviewed today.  5.  Preoperative assessment: The patient has no functional limitation and a workload much greater than 4 metabolic equivalents.  She is stable from a cardiac perspective with normal left ventricular function, no signs of congestive heart failure, and no recurrence of angina.  She is able to stop ticagrelor since she is 12 months out from her MI.  If possible she should remain  on aspirin 81 mg in the perioperative period.  If her bleeding risk is too high it would be reasonable for her to stop aspirin for surgery, to be resumed as soon as possible after surgery.  I think there are a lot of potential benefits to bariatric surgery in her case considering her history of myocardial infarction, hypertension, and diabetes, all of which are related to her obesity.  Current medicines are reviewed with the patient today.  The patient does not have concerns regarding medicines.  Labs/ tests ordered today include:   Orders Placed This Encounter  Procedures  . Hepatic function panel  . Lipid panel  Disposition:   FU one year  Signed, Tonny Bollman, MD  12/10/2017 4:30 PM    [facility] [address] [place], [place], [place]  [address] Phone: [phone]; Fax: [phone]

## 2017-12-10 NOTE — Patient Instructions (Signed)
Medication Instructions:  1) STOP BRILINTA  Labwork: Your provider recommends that you return for FASTING labs.   Testing/Procedures: None  Follow-Up: Your provider wants you to follow-up in: 1 year with Dr. Excell Seltzer. You will receive a reminder letter in the mail two months in advance. If you don't receive a letter, please call our office to schedule the follow-up appointment.    Any Other Special Instructions Will Be Listed Below (If Applicable).     If you need a refill on your cardiac medications before your next appointment, please call your pharmacy.

## 2017-12-15 ENCOUNTER — Other Ambulatory Visit: Payer: BLUE CROSS/BLUE SHIELD | Admitting: *Deleted

## 2017-12-15 DIAGNOSIS — I2510 Atherosclerotic heart disease of native coronary artery without angina pectoris: Secondary | ICD-10-CM | POA: Diagnosis not present

## 2017-12-16 LAB — HEPATIC FUNCTION PANEL
ALT: 27 IU/L (ref 0–32)
AST: 19 IU/L (ref 0–40)
Albumin: 4.1 g/dL (ref 3.6–4.8)
Alkaline Phosphatase: 136 IU/L — ABNORMAL HIGH (ref 39–117)
BILIRUBIN TOTAL: 0.6 mg/dL (ref 0.0–1.2)
BILIRUBIN, DIRECT: 0.16 mg/dL (ref 0.00–0.40)
Total Protein: 6.8 g/dL (ref 6.0–8.5)

## 2017-12-16 LAB — LIPID PANEL
Chol/HDL Ratio: 2.9 ratio (ref 0.0–4.4)
Cholesterol, Total: 130 mg/dL (ref 100–199)
HDL: 45 mg/dL (ref >39)
LDL Calculated: 59 mg/dL (ref 0–99)
Triglycerides: 128 mg/dL (ref 0–149)
VLDL CHOLESTEROL CAL: 26 mg/dL (ref 5–40)

## 2017-12-17 ENCOUNTER — Ambulatory Visit: Payer: BLUE CROSS/BLUE SHIELD | Admitting: Cardiovascular Disease

## 2017-12-22 ENCOUNTER — Encounter: Payer: BLUE CROSS/BLUE SHIELD | Attending: General Surgery | Admitting: Skilled Nursing Facility1

## 2017-12-22 DIAGNOSIS — Z713 Dietary counseling and surveillance: Secondary | ICD-10-CM | POA: Diagnosis not present

## 2017-12-22 DIAGNOSIS — Z6841 Body Mass Index (BMI) 40.0 and over, adult: Secondary | ICD-10-CM | POA: Diagnosis not present

## 2017-12-22 DIAGNOSIS — E119 Type 2 diabetes mellitus without complications: Secondary | ICD-10-CM

## 2017-12-22 NOTE — Progress Notes (Signed)
Pre-Op Assessment Visit:  Pre-Operative RYGB Surgery  Medical Nutrition Therapy:  Appt start time: 2:00  End time:  3:10  Patient was seen on 12/22/2017 for Pre-Operative Nutrition Assessment. Assessment and [facility] on 12/22/2017.   Pt states she had a MI last May. Pt states she has had diabetes for about 4-5 years.Pt states she checks her blood sugar 2 times a day: 147 in the morning (not always fasting) and bedtime about 3-4 hours after having eaten: 110 one time below 70. Pt staets her husband works nights. 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   Pt expectation of surgery: to feel better and get off medications   Pt expectation of Dietitian: none  Start weight at [facility]: 278 BMI: 45.98   24 hr Dietary Recall: First Meal: fast food biscuit Snack:  Second Meal: fast food Snack: chips or peanut butter crackes Third Meal: doritos and salsa and canned peaches  Snack:  Beverages: sweet tea, soda, water, green tea   Encouraged to engage in 150 minutes of moderate physical activity including cardiovascular and weight baring weekly  Handouts given during visit include:  . Pre-Op Goals . Bariatric Surgery Protein Shakes During the appointment today the following Pre-Op Goals were reviewed with the patient: . Maintain or lose weight as instructed by your surgeon . Make healthy food choices . Begin to limit portion sizes . Limited concentrated sugars and fried foods . Keep fat/sugar in the single digits per serving on             food labels . Practice CHEWING your food  (aim for 30 chews per bite or until applesauce consistency) . Practice not drinking 15 minutes before, during, and 30 minutes after each meal/snack . Avoid all carbonated beverages  . Avoid/limit caffeinated beverages  . Avoid all sugar-sweetened beverages . Consume 3 meals per day; eat every 3-5 hours . Make a list of  non-food related activities . Aim for 64-100 ounces of FLUID daily  . Aim for at least 60-80 grams of PROTEIN daily . Look for a liquid protein source that contain ?15 g protein and ?5 g carbohydrate  (ex: shakes, drinks, shots)  -Follow diet recommendations listed below   Energy and Macronutrient Recomendations: Calories: 1400 Carbohydrate: 158 Protein: 105 Fat: 39  Demonstrated degree of understanding via:  Teach Back  Teaching Method Utilized:  Visual Auditory Hands on  Barriers to learning/adherence to lifestyle change: none identified   Patient to call the [facility] to enroll in Pre-Op and Post-Op Nutrition Education when surgery date is scheduled.

## 2018-01-11 ENCOUNTER — Ambulatory Visit (INDEPENDENT_AMBULATORY_CARE_PROVIDER_SITE_OTHER): Payer: BLUE CROSS/BLUE SHIELD | Admitting: Psychiatry

## 2018-01-11 DIAGNOSIS — F509 Eating disorder, unspecified: Secondary | ICD-10-CM | POA: Diagnosis not present

## 2018-01-24 ENCOUNTER — Other Ambulatory Visit: Payer: Self-pay | Admitting: Cardiovascular Disease

## 2018-01-24 DIAGNOSIS — H1033 Unspecified acute conjunctivitis, bilateral: Secondary | ICD-10-CM | POA: Diagnosis not present

## 2018-01-24 DIAGNOSIS — Z6841 Body Mass Index (BMI) 40.0 and over, adult: Secondary | ICD-10-CM | POA: Diagnosis not present

## 2018-01-24 DIAGNOSIS — J019 Acute sinusitis, unspecified: Secondary | ICD-10-CM | POA: Diagnosis not present

## 2018-01-24 MED ORDER — OLMESARTAN MEDOXOMIL-HCTZ 40-12.5 MG PO TABS: 1.0000 | ORAL_TABLET | Freq: Every day | ORAL | 3 refills | Status: DC

## 2018-02-07 ENCOUNTER — Encounter: Payer: BLUE CROSS/BLUE SHIELD | Attending: General Surgery | Admitting: Registered"

## 2018-02-07 DIAGNOSIS — E119 Type 2 diabetes mellitus without complications: Secondary | ICD-10-CM

## 2018-02-07 DIAGNOSIS — Z713 Dietary counseling and surveillance: Secondary | ICD-10-CM | POA: Diagnosis not present

## 2018-02-07 DIAGNOSIS — Z6841 Body Mass Index (BMI) 40.0 and over, adult: Secondary | ICD-10-CM | POA: Insufficient documentation

## 2018-02-07 NOTE — Progress Notes (Signed)
  Pre-Operative Nutrition Class:  Appt start time: 8:15   End time: 9:15.  Patient was seen on 02/07/2018 for Pre-Operative Bariatric Surgery Education at [facility].   Surgery date: TBD Surgery type: RYGB Start weight at [facility]: 278 Weight today: 281.3   Samples given per MNT protocol. Patient educated on appropriate usage: Bariatric Advantage Multivitamin Lot # [id number] Exp: 04/2019  Bariatric Advantage Calcium Citrate Lot # [id number] Exp: 08/02/2018  Bariatric Advantage Calcium Citrate Lot # [id number] Exp: 11/11/2018  Premier Protein Shake Lot # [id number] Exp: 10/19/2018   The following the learning objectives were met by the patient during this course:  Identify Pre-Op Dietary Goals and will begin 2 weeks pre-operatively  Identify appropriate sources of fluids and proteins   State protein recommendations and appropriate sources pre and post-operatively  Identify Post-Operative Dietary Goals and will follow for 2 weeks post-operatively  Identify appropriate multivitamin and calcium sources  Describe the need for physical activity post-operatively and will follow MD recommendations  State when to call healthcare provider regarding medication questions or post-operative complications  Handouts given during class include:  Pre-Op Bariatric Surgery Diet Handout  Protein Shake Handout  Post-Op Bariatric Surgery Nutrition Handout  [facility] Program Information Flyer  Support Group Information Flyer  [facility] Bariatric Supplements Price List  Follow-Up Plan: Patient will follow-up at [facility] 2 weeks post operatively for diet advancement per MD.

## 2018-02-09 ENCOUNTER — Ambulatory Visit (INDEPENDENT_AMBULATORY_CARE_PROVIDER_SITE_OTHER): Payer: BLUE CROSS/BLUE SHIELD | Admitting: Psychiatry

## 2018-02-09 DIAGNOSIS — F509 Eating disorder, unspecified: Secondary | ICD-10-CM

## 2018-02-18 DIAGNOSIS — R110 Nausea: Secondary | ICD-10-CM | POA: Diagnosis not present

## 2018-02-18 DIAGNOSIS — H8149 Vertigo of central origin, unspecified ear: Secondary | ICD-10-CM | POA: Diagnosis not present

## 2018-02-18 DIAGNOSIS — H6500 Acute serous otitis media, unspecified ear: Secondary | ICD-10-CM | POA: Diagnosis not present

## 2018-03-18 MED FILL — oxyCODONE HCL 5 MG/5ML SOLN: 5 | 2 days supply | Qty: 120 | Fill #0

## 2018-04-19 NOTE — Patient Instructions (Signed)
PATI THINNES  04/19/2018   Your procedure is scheduled on: 04-25-18     Report to [facility] Main  Entrance    Report to Admitting at 9:15 AM    Call this number if you have problems the morning of surgery [phone]     Remember: Do not eat food or drink liquids :After Midnight.     Take these medicines the morning of surgery with A SIP OF WATER: Metoprolol (Lopressor)   BRUSH YOUR TEETH MORNING OF SURGERY AND RINSE YOUR MOUTH OUT, NO CHEWING GUM CANDY OR MINTS.    DO NOT TAKE ANY DIABETIC MEDICATIONS DAY OF YOUR SURGERY                               You may not have any metal on your body including hair pins and              piercings  Do not wear jewelry, make-up, lotions, powders or perfumes, deodorant             Men may shave face and neck.   Do not bring valuables to the hospital. [facility] IS NOT             RESPONSIBLE   FOR VALUABLES.  Contacts, dentures or bridgework may not be worn into surgery.  Leave suitcase in the car. After surgery it may be brought to your room.   Special Instructions: N/A              Please read over the following fact sheets you were given: _____________________________________________________________________  How to Manage Your Diabetes Before and After Surgery  Why is it important to control my blood sugar before and after surgery? . Improving blood sugar levels before and after surgery helps healing and can limit problems. . A way of improving blood sugar control is eating a healthy diet by: o  Eating less sugar and carbohydrates o  Increasing activity/exercise o  Talking with your doctor about reaching your blood sugar goals . High blood sugars (greater than 180 mg/dL) can raise your risk of infections and slow your recovery, so you will need to focus on controlling your diabetes during the weeks before surgery. . Make sure that the doctor who takes care of your diabetes knows about your planned  surgery including the date and location.  How do I manage my blood sugar before surgery? . Check your blood sugar at least 4 times a day, starting 2 days before surgery, to make sure that the level is not too high or low. o Check your blood sugar the morning of your surgery when you wake up and every 2 hours until you get to the Short Stay unit. . If your blood sugar is less than 70 mg/dL, you will need to treat for low blood sugar: o Do not take insulin. o Treat a low blood sugar (less than 70 mg/dL) with  cup of clear juice (cranberry or apple), 4 glucose tablets, OR glucose gel. o Recheck blood sugar in 15 minutes after treatment (to make sure it is greater than 70 mg/dL). If your blood sugar is not greater than 70 mg/dL on recheck, call [phone] for further instructions. . Report your blood sugar to the short stay nurse when you get to Short Stay.  . If you are admitted  to the hospital after surgery: o Your blood sugar will be checked by the staff and you will probably be given insulin after surgery (instead of oral diabetes medicines) to make sure you have good blood sugar levels. o The goal for blood sugar control after surgery is 80-180 mg/dL.   WHAT DO I DO ABOUT MY DIABETES MEDICATION?  Marland Kitchen Do not take oral diabetes medicines (pills) the morning of surgery.  . THE DAY BEFORE SURGERY, take only your morning dose of Glipizide. You may also bring and use your usual dose of Metformin                [facility] - Preparing for Surgery Before surgery, you can play an important role.  Because skin is not sterile, your skin needs to be as free of germs as possible.  You can reduce the number of germs on your skin by washing with CHG (chlorahexidine gluconate) soap before surgery.  CHG is an antiseptic cleaner which kills germs and bonds with the skin to continue killing germs even after washing. Please DO NOT use if you have an allergy to CHG or antibacterial soaps.  If your skin  becomes reddened/irritated stop using the CHG and inform your nurse when you arrive at Short Stay. Do not shave (including legs and underarms) for at least 48 hours prior to the first CHG shower.  You may shave your face/neck. Please follow these instructions carefully:  1.  Shower with CHG Soap the night before surgery and the  morning of Surgery.  2.  If you choose to wash your hair, wash your hair first as usual with your  normal  shampoo.  3.  After you shampoo, rinse your hair and body thoroughly to remove the  shampoo.                           4.  Use CHG as you would any other liquid soap.  You can apply chg directly  to the skin and wash                       Gently with a scrungie or clean washcloth.  5.  Apply the CHG Soap to your body ONLY FROM THE NECK DOWN.   Do not use on face/ open                           Wound or open sores. Avoid contact with eyes, ears mouth and genitals (private parts).                       Wash face,  Genitals (private parts) with your normal soap.             6.  Wash thoroughly, paying special attention to the area where your surgery  will be performed.  7.  Thoroughly rinse your body with warm water from the neck down.  8.  DO NOT shower/wash with your normal soap after using and rinsing off  the CHG Soap.                9.  Pat yourself dry with a clean towel.            10.  Wear clean pajamas.            11.  Place clean sheets on your bed the night  of your first shower and do not  sleep with pets. Day of Surgery : Do not apply any lotions/deodorants the morning of surgery.  Please wear clean clothes to the hospital/surgery center.  FAILURE TO FOLLOW THESE INSTRUCTIONS MAY RESULT IN THE CANCELLATION OF YOUR SURGERY PATIENT SIGNATURE_________________________________  NURSE SIGNATURE__________________________________  ________________________________________________________________________

## 2018-04-19 NOTE — Progress Notes (Signed)
12-10-17 (Epic) Cardiac Clearance from Dr. Excell Seltzer  12-02-17 [facility]) EKG

## 2018-04-20 ENCOUNTER — Encounter (HOSPITAL_COMMUNITY): Payer: Self-pay

## 2018-04-20 ENCOUNTER — Encounter (HOSPITAL_COMMUNITY)
Admission: RE | Admit: 2018-04-20 | Discharge: 2018-04-20 | Disposition: A | Discharge status: Home / Self Care | Payer: BLUE CROSS/BLUE SHIELD | Source: Ambulatory Visit | Attending: General Surgery | Admitting: General Surgery

## 2018-04-20 ENCOUNTER — Other Ambulatory Visit: Payer: Self-pay

## 2018-04-20 DIAGNOSIS — Z01812 Encounter for preprocedural laboratory examination: Secondary | ICD-10-CM | POA: Diagnosis not present

## 2018-04-20 LAB — BASIC METABOLIC PANEL
ANION GAP: 11 (ref 5–15)
BUN: 67 mg/dL — ABNORMAL HIGH (ref 6–20)
CALCIUM: 9.7 mg/dL (ref 8.9–10.3)
CO2: 20 mmol/L — ABNORMAL LOW (ref 22–32)
Chloride: 106 mmol/L (ref 98–111)
Creatinine, Ser: 1.7 mg/dL — ABNORMAL HIGH (ref 0.44–1.00)
GFR calc Af Amer: 37 mL/min — ABNORMAL LOW (ref >60)
GFR calc non Af Amer: 32 mL/min — ABNORMAL LOW (ref >60)
Glucose, Bld: 315 mg/dL — ABNORMAL HIGH (ref 70–99)
POTASSIUM: 4.2 mmol/L (ref 3.5–5.1)
SODIUM: 137 mmol/L (ref 135–145)

## 2018-04-20 LAB — HEMOGLOBIN A1C
Hgb A1c MFr Bld: 9.9 % — ABNORMAL HIGH (ref 4.8–5.6)
Mean Plasma Glucose: 237.43 mg/dL

## 2018-04-20 LAB — CBC
HCT: 36.8 % (ref 36.0–46.0)
HEMOGLOBIN: 11.4 g/dL — AB (ref 12.0–15.0)
MCH: 28.2 pg (ref 26.0–34.0)
MCHC: 31 g/dL (ref 30.0–36.0)
MCV: 91.1 fL (ref 80.0–100.0)
PLATELETS: 333 10*3/uL (ref 150–400)
RBC: 4.04 MIL/uL (ref 3.87–5.11)
RDW: 12.8 % (ref 11.5–15.5)
WBC: 8 10*3/uL (ref 4.0–10.5)
nRBC: 0 % (ref 0.0–0.2)

## 2018-04-20 LAB — GLUCOSE, CAPILLARY: Glucose-Capillary: 252 mg/dL — ABNORMAL HIGH (ref 70–99)

## 2018-04-20 LAB — NO BLOOD PRODUCTS

## 2018-04-20 NOTE — Progress Notes (Signed)
Refusal of blood form faxed to Dr. Johna Sheriff and [facility]. Successful transmission of receipt of fax in chart

## 2018-04-25 ENCOUNTER — Inpatient Hospital Stay (HOSPITAL_COMMUNITY): Payer: BLUE CROSS/BLUE SHIELD | Admitting: Certified Registered Nurse Anesthetist

## 2018-04-25 ENCOUNTER — Encounter (HOSPITAL_COMMUNITY)
Admission: RE | Disposition: A | Discharge status: Home / Self Care | Payer: Self-pay | Source: Home / Self Care | Attending: General Surgery

## 2018-04-25 ENCOUNTER — Inpatient Hospital Stay (HOSPITAL_COMMUNITY)
Admission: RE | Admit: 2018-04-25 | Discharge: 2018-04-27 | DRG: 621 | Disposition: A | Discharge status: Home / Self Care | Payer: BLUE CROSS/BLUE SHIELD | Attending: General Surgery | Admitting: General Surgery

## 2018-04-25 ENCOUNTER — Encounter (HOSPITAL_COMMUNITY): Payer: Self-pay

## 2018-04-25 ENCOUNTER — Other Ambulatory Visit: Payer: Self-pay

## 2018-04-25 DIAGNOSIS — Z79899 Other long term (current) drug therapy: Secondary | ICD-10-CM | POA: Diagnosis not present

## 2018-04-25 DIAGNOSIS — Z7902 Long term (current) use of antithrombotics/antiplatelets: Secondary | ICD-10-CM

## 2018-04-25 DIAGNOSIS — E119 Type 2 diabetes mellitus without complications: Secondary | ICD-10-CM | POA: Diagnosis present

## 2018-04-25 DIAGNOSIS — Z6841 Body Mass Index (BMI) 40.0 and over, adult: Secondary | ICD-10-CM

## 2018-04-25 DIAGNOSIS — Z8249 Family history of ischemic heart disease and other diseases of the circulatory system: Secondary | ICD-10-CM | POA: Diagnosis not present

## 2018-04-25 DIAGNOSIS — I10 Essential (primary) hypertension: Secondary | ICD-10-CM | POA: Diagnosis not present

## 2018-04-25 DIAGNOSIS — I2510 Atherosclerotic heart disease of native coronary artery without angina pectoris: Secondary | ICD-10-CM | POA: Diagnosis not present

## 2018-04-25 DIAGNOSIS — K219 Gastro-esophageal reflux disease without esophagitis: Secondary | ICD-10-CM | POA: Diagnosis present

## 2018-04-25 DIAGNOSIS — I252 Old myocardial infarction: Secondary | ICD-10-CM

## 2018-04-25 DIAGNOSIS — K449 Diaphragmatic hernia without obstruction or gangrene: Secondary | ICD-10-CM | POA: Diagnosis present

## 2018-04-25 DIAGNOSIS — Z833 Family history of diabetes mellitus: Secondary | ICD-10-CM

## 2018-04-25 DIAGNOSIS — Z7982 Long term (current) use of aspirin: Secondary | ICD-10-CM | POA: Diagnosis not present

## 2018-04-25 HISTORY — PX: GASTRIC ROUX-EN-Y: SHX5262

## 2018-04-25 LAB — HEMOGLOBIN AND HEMATOCRIT, BLOOD
HCT: 37.1 % (ref 36.0–46.0)
Hemoglobin: 11.5 g/dL — ABNORMAL LOW (ref 12.0–15.0)

## 2018-04-25 LAB — GLUCOSE, CAPILLARY
GLUCOSE-CAPILLARY: 132 mg/dL — AB (ref 70–99)
GLUCOSE-CAPILLARY: 207 mg/dL — AB (ref 70–99)
Glucose-Capillary: 165 mg/dL — ABNORMAL HIGH (ref 70–99)
Glucose-Capillary: 212 mg/dL — ABNORMAL HIGH (ref 70–99)

## 2018-04-25 SURGERY — LAPAROSCOPIC ROUX-EN-Y GASTRIC BYPASS WITH UPPER ENDOSCOPY
Anesthesia: General | Site: Abdomen

## 2018-04-25 MED ORDER — FENTANYL CITRATE (PF) 100 MCG/2ML IJ SOLN
INTRAMUSCULAR | Status: AC
  Filled 2018-04-25: qty 2

## 2018-04-25 MED ORDER — BUPIVACAINE HCL (PF) 0.25 % IJ SOLN
INTRAMUSCULAR | Status: DC | PRN
  Administered 2018-04-25: 30 mL

## 2018-04-25 MED ORDER — ONDANSETRON HCL 4 MG/2ML IJ SOLN
INTRAMUSCULAR | Status: DC | PRN
  Administered 2018-04-25: 4 mg via INTRAVENOUS

## 2018-04-25 MED ORDER — LIDOCAINE HCL 2 % IJ SOLN
INTRAMUSCULAR | Status: AC
  Filled 2018-04-25: qty 20

## 2018-04-25 MED ORDER — GABAPENTIN 300 MG PO CAPS
300.0000 mg | ORAL_CAPSULE | ORAL | Status: AC
  Administered 2018-04-25: 300 mg via ORAL
  Filled 2018-04-25: qty 1

## 2018-04-25 MED ORDER — MORPHINE SULFATE (PF) 2 MG/ML IV SOLN
1.0000 mg | INTRAVENOUS | Status: DC | PRN
  Administered 2018-04-25: 2 mg via INTRAVENOUS
  Filled 2018-04-25: qty 1

## 2018-04-25 MED ORDER — SUGAMMADEX SODIUM 500 MG/5ML IV SOLN
INTRAVENOUS | Status: AC
  Filled 2018-04-25: qty 5

## 2018-04-25 MED ORDER — SODIUM CHLORIDE 0.9 % IV SOLN
2.0000 g | Freq: Once | INTRAVENOUS | Status: AC
  Administered 2018-04-25: 2 g via INTRAVENOUS

## 2018-04-25 MED ORDER — BUPIVACAINE LIPOSOME 1.3 % IJ SUSP
20.0000 mL | Freq: Once | INTRAMUSCULAR | Status: AC
  Administered 2018-04-25: 20 mL
  Filled 2018-04-25: qty 20

## 2018-04-25 MED ORDER — BUPIVACAINE-EPINEPHRINE (PF) 0.5% -1:200000 IJ SOLN
INTRAMUSCULAR | Status: AC
  Filled 2018-04-25: qty 30

## 2018-04-25 MED ORDER — PROPOFOL 10 MG/ML IV BOLUS
INTRAVENOUS | Status: AC
  Filled 2018-04-25: qty 20

## 2018-04-25 MED ORDER — PREMIER PROTEIN SHAKE
2.0000 [oz_av] | ORAL | Status: DC
  Administered 2018-04-26 – 2018-04-27 (×9): 2 [oz_av] via ORAL

## 2018-04-25 MED ORDER — MIDAZOLAM HCL 5 MG/5ML IJ SOLN
INTRAMUSCULAR | Status: DC | PRN
  Administered 2018-04-25: 2 mg via INTRAVENOUS

## 2018-04-25 MED ORDER — MIDAZOLAM HCL 2 MG/2ML IJ SOLN
INTRAMUSCULAR | Status: AC
  Filled 2018-04-25: qty 2

## 2018-04-25 MED ORDER — PHENYLEPHRINE 40 MCG/ML (10ML) SYRINGE FOR IV PUSH (FOR BLOOD PRESSURE SUPPORT)
PREFILLED_SYRINGE | INTRAVENOUS | Status: AC
  Filled 2018-04-25: qty 10

## 2018-04-25 MED ORDER — PHENYLEPHRINE HCL 10 MG/ML IJ SOLN
INTRAMUSCULAR | Status: AC
  Filled 2018-04-25: qty 2

## 2018-04-25 MED ORDER — SODIUM CHLORIDE 0.9 % IJ SOLN
INTRAMUSCULAR | Status: AC
  Filled 2018-04-25: qty 50

## 2018-04-25 MED ORDER — OXYCODONE HCL 5 MG/5ML PO SOLN
5.0000 mg | Freq: Once | ORAL | Status: DC | PRN
  Filled 2018-04-25: qty 5

## 2018-04-25 MED ORDER — 0.9 % SODIUM CHLORIDE (POUR BTL) OPTIME
TOPICAL | Status: DC | PRN
  Administered 2018-04-25: 1000 mL

## 2018-04-25 MED ORDER — LACTATED RINGERS IR SOLN
Status: DC | PRN
  Administered 2018-04-25: 1000 mL

## 2018-04-25 MED ORDER — SODIUM CHLORIDE 0.9 % IV SOLN
INTRAVENOUS | Status: DC | PRN
  Administered 2018-04-25: 30 ug/min via INTRAVENOUS

## 2018-04-25 MED ORDER — CELECOXIB 200 MG PO CAPS
400.0000 mg | ORAL_CAPSULE | ORAL | Status: AC
  Administered 2018-04-25: 400 mg via ORAL
  Filled 2018-04-25: qty 2

## 2018-04-25 MED ORDER — ROCURONIUM BROMIDE 10 MG/ML (PF) SYRINGE
PREFILLED_SYRINGE | INTRAVENOUS | Status: DC | PRN
  Administered 2018-04-25: 20 mg via INTRAVENOUS
  Administered 2018-04-25: 80 mg via INTRAVENOUS
  Administered 2018-04-25: 20 mg via INTRAVENOUS

## 2018-04-25 MED ORDER — EVICEL 5 ML EX KIT
PACK | Freq: Once | CUTANEOUS | Status: DC
  Filled 2018-04-25: qty 1

## 2018-04-25 MED ORDER — FENTANYL CITRATE (PF) 100 MCG/2ML IJ SOLN
INTRAMUSCULAR | Status: DC | PRN
  Administered 2018-04-25: 50 ug via INTRAVENOUS
  Administered 2018-04-25: 100 ug via INTRAVENOUS
  Administered 2018-04-25 (×2): 50 ug via INTRAVENOUS

## 2018-04-25 MED ORDER — BUPIVACAINE HCL (PF) 0.25 % IJ SOLN
INTRAMUSCULAR | Status: AC
  Filled 2018-04-25: qty 30

## 2018-04-25 MED ORDER — OXYCODONE HCL 5 MG PO TABS: 5.0000 mg | ORAL_TABLET | Freq: Once | ORAL | Status: DC | PRN

## 2018-04-25 MED ORDER — LIDOCAINE 2% (20 MG/ML) 5 ML SYRINGE
INTRAMUSCULAR | Status: DC | PRN
  Administered 2018-04-25: 1.5 mg/kg/h via INTRAVENOUS

## 2018-04-25 MED ORDER — SUGAMMADEX SODIUM 200 MG/2ML IV SOLN
INTRAVENOUS | Status: DC | PRN
  Administered 2018-04-25: 500 mg via INTRAVENOUS

## 2018-04-25 MED ORDER — SODIUM CHLORIDE 0.9 % IJ SOLN
INTRAMUSCULAR | Status: DC | PRN
  Administered 2018-04-25: 50 mL

## 2018-04-25 MED ORDER — LIDOCAINE 2% (20 MG/ML) 5 ML SYRINGE
INTRAMUSCULAR | Status: AC
  Filled 2018-04-25: qty 5

## 2018-04-25 MED ORDER — DEXAMETHASONE SODIUM PHOSPHATE 10 MG/ML IJ SOLN
INTRAMUSCULAR | Status: DC | PRN
  Administered 2018-04-25: 10 mg via INTRAVENOUS

## 2018-04-25 MED ORDER — PROPOFOL 10 MG/ML IV BOLUS
INTRAVENOUS | Status: DC | PRN
  Administered 2018-04-25: 180 mg via INTRAVENOUS

## 2018-04-25 MED ORDER — FAMOTIDINE IN NACL 20-0.9 MG/50ML-% IV SOLN
20.0000 mg | Freq: Two times a day (BID) | INTRAVENOUS | Status: DC
  Administered 2018-04-25 – 2018-04-26 (×3): 20 mg via INTRAVENOUS
  Filled 2018-04-25 (×3): qty 50

## 2018-04-25 MED ORDER — METOPROLOL TARTRATE 5 MG/5ML IV SOLN
5.0000 mg | Freq: Four times a day (QID) | INTRAVENOUS | Status: DC
  Administered 2018-04-25 – 2018-04-26 (×3): 5 mg via INTRAVENOUS
  Filled 2018-04-25 (×4): qty 5

## 2018-04-25 MED ORDER — OXYCODONE HCL 5 MG/5ML PO SOLN
5.0000 mg | ORAL | Status: DC | PRN
  Administered 2018-04-25 – 2018-04-26 (×5): 5 mg via ORAL
  Filled 2018-04-25 (×5): qty 5

## 2018-04-25 MED ORDER — KETAMINE HCL 10 MG/ML IJ SOLN
INTRAMUSCULAR | Status: DC | PRN
  Administered 2018-04-25: 30 mg via INTRAVENOUS

## 2018-04-25 MED ORDER — DEXAMETHASONE SODIUM PHOSPHATE 4 MG/ML IJ SOLN
4.0000 mg | INTRAMUSCULAR | Status: AC
  Administered 2018-04-25: 10 mg via INTRAVENOUS

## 2018-04-25 MED ORDER — PNEUMOCOCCAL VAC POLYVALENT 25 MCG/0.5ML IJ INJ
0.5000 mL | INJECTION | INTRAMUSCULAR | Status: DC
  Filled 2018-04-25: qty 0.5

## 2018-04-25 MED ORDER — SCOPOLAMINE 1 MG/3DAYS TD PT72
1.0000 | MEDICATED_PATCH | TRANSDERMAL | Status: DC
  Administered 2018-04-25: 1.5 mg via TRANSDERMAL
  Filled 2018-04-25: qty 1

## 2018-04-25 MED ORDER — ROCURONIUM BROMIDE 100 MG/10ML IV SOLN
INTRAVENOUS | Status: AC
  Filled 2018-04-25: qty 1

## 2018-04-25 MED ORDER — ONDANSETRON HCL 4 MG/2ML IJ SOLN: 4.0000 mg | INTRAMUSCULAR | Status: DC | PRN

## 2018-04-25 MED ORDER — ACETAMINOPHEN 160 MG/5ML PO SOLN
650.0000 mg | Freq: Four times a day (QID) | ORAL | Status: DC
  Administered 2018-04-25 – 2018-04-26 (×5): 650 mg via ORAL
  Filled 2018-04-25 (×6): qty 20.3

## 2018-04-25 MED ORDER — SUCCINYLCHOLINE CHLORIDE 200 MG/10ML IV SOSY
PREFILLED_SYRINGE | INTRAVENOUS | Status: DC | PRN
  Administered 2018-04-25: 160 mg via INTRAVENOUS

## 2018-04-25 MED ORDER — LACTATED RINGERS IV SOLN
INTRAVENOUS | Status: DC
  Administered 2018-04-25 (×2): via INTRAVENOUS

## 2018-04-25 MED ORDER — PHENYLEPHRINE 40 MCG/ML (10ML) SYRINGE FOR IV PUSH (FOR BLOOD PRESSURE SUPPORT)
PREFILLED_SYRINGE | INTRAVENOUS | Status: DC | PRN
  Administered 2018-04-25 (×5): 80 ug via INTRAVENOUS

## 2018-04-25 MED ORDER — ENOXAPARIN SODIUM 30 MG/0.3ML ~~LOC~~ SOLN
30.0000 mg | Freq: Two times a day (BID) | SUBCUTANEOUS | Status: DC
  Administered 2018-04-26 – 2018-04-27 (×3): 30 mg via SUBCUTANEOUS
  Filled 2018-04-25 (×3): qty 0.3

## 2018-04-25 MED ORDER — ONDANSETRON HCL 4 MG/2ML IJ SOLN
INTRAMUSCULAR | Status: AC
  Filled 2018-04-25: qty 2

## 2018-04-25 MED ORDER — SODIUM CHLORIDE 0.9 % IV SOLN
INTRAVENOUS | Status: AC
  Filled 2018-04-25: qty 2

## 2018-04-25 MED ORDER — ACETAMINOPHEN 500 MG PO TABS
1000.0000 mg | ORAL_TABLET | ORAL | Status: AC
  Administered 2018-04-25: 1000 mg via ORAL
  Filled 2018-04-25: qty 2

## 2018-04-25 MED ORDER — NITROGLYCERIN 0.4 MG SL SUBL: 0.4000 mg | SUBLINGUAL_TABLET | SUBLINGUAL | Status: DC | PRN

## 2018-04-25 MED ORDER — FENTANYL CITRATE (PF) 100 MCG/2ML IJ SOLN: 25.0000 ug | INTRAMUSCULAR | Status: DC | PRN

## 2018-04-25 MED ORDER — INSULIN ASPART 100 UNIT/ML ~~LOC~~ SOLN
0.0000 [IU] | SUBCUTANEOUS | Status: DC
  Administered 2018-04-25: 3 [IU] via SUBCUTANEOUS
  Administered 2018-04-25 (×2): 5 [IU] via SUBCUTANEOUS
  Administered 2018-04-26 (×6): 3 [IU] via SUBCUTANEOUS
  Administered 2018-04-27: 2 [IU] via SUBCUTANEOUS

## 2018-04-25 MED ORDER — SUCCINYLCHOLINE CHLORIDE 200 MG/10ML IV SOSY
PREFILLED_SYRINGE | INTRAVENOUS | Status: AC
  Filled 2018-04-25: qty 10

## 2018-04-25 MED ORDER — LIDOCAINE 2% (20 MG/ML) 5 ML SYRINGE
INTRAMUSCULAR | Status: DC | PRN
  Administered 2018-04-25: 60 mg via INTRAVENOUS

## 2018-04-25 MED ORDER — POTASSIUM CHLORIDE IN NACL 20-0.9 MEQ/L-% IV SOLN
INTRAVENOUS | Status: DC
  Administered 2018-04-25 – 2018-04-27 (×3): via INTRAVENOUS
  Filled 2018-04-25 (×4): qty 1000

## 2018-04-25 MED ORDER — PROMETHAZINE HCL 25 MG/ML IJ SOLN: 6.2500 mg | INTRAMUSCULAR | Status: DC | PRN

## 2018-04-25 SURGICAL SUPPLY — 71 items
APPLIER CLIP ROT 10 11.4 M/L (STAPLE)
APPLIER CLIP ROT 13.4 12 LRG (CLIP)
BLADE SURG SZ11 CARB STEEL (BLADE) ×2 IMPLANT
CABLE HIGH FREQUENCY MONO STRZ (ELECTRODE) ×2 IMPLANT
CHLORAPREP W/TINT 26ML (MISCELLANEOUS) ×2 IMPLANT
CLIP APPLIE ROT 10 11.4 M/L (STAPLE) IMPLANT
CLIP APPLIE ROT 13.4 12 LRG (CLIP) IMPLANT
CLIP SUT LAPRA TY ABSORB (SUTURE) ×4 IMPLANT
COVER WAND RF STERILE (DRAPES) IMPLANT
CUTTER FLEX LINEAR 45M (STAPLE) ×2 IMPLANT
DECANTER SPIKE VIAL GLASS SM (MISCELLANEOUS) ×2 IMPLANT
DERMABOND ADVANCED (GAUZE/BANDAGES/DRESSINGS) ×1
DERMABOND ADVANCED .7 DNX12 (GAUZE/BANDAGES/DRESSINGS) ×1 IMPLANT
DEVICE SUT QUICK LOAD TK 5 (STAPLE) ×2 IMPLANT
DEVICE SUT TI-KNOT TK 5X26 (MISCELLANEOUS) ×2 IMPLANT
DEVICE SUTURE ENDOST 10MM (ENDOMECHANICALS) ×2 IMPLANT
DISSECTOR BLUNT TIP ENDO 5MM (MISCELLANEOUS) IMPLANT
DRAIN PENROSE 18X1/4 LTX STRL (WOUND CARE) ×2 IMPLANT
ELECT REM PT RETURN 15FT ADLT (MISCELLANEOUS) ×2 IMPLANT
GAUZE 4X4 16PLY RFD (DISPOSABLE) IMPLANT
GAUZE SPONGE 4X4 12PLY STRL (GAUZE/BANDAGES/DRESSINGS) ×2 IMPLANT
GLOVE BIOGEL PI IND STRL 7.5 (GLOVE) ×1 IMPLANT
GLOVE BIOGEL PI INDICATOR 7.5 (GLOVE) ×1
GLOVE ECLIPSE 7.5 STRL STRAW (GLOVE) ×2 IMPLANT
GOWN STRL REUS W/TWL XL LVL3 (GOWN DISPOSABLE) ×4 IMPLANT
HEMOSTAT SURGICEL 4X8 (HEMOSTASIS) IMPLANT
HOVERMATT SINGLE USE (MISCELLANEOUS) ×2 IMPLANT
KIT BASIN OR (CUSTOM PROCEDURE TRAY) ×2 IMPLANT
KIT GASTRIC LAVAGE 34FR ADT (SET/KITS/TRAYS/PACK) ×2 IMPLANT
LUBRICANT JELLY K Y 4OZ (MISCELLANEOUS) IMPLANT
MARKER SKIN DUAL TIP RULER LAB (MISCELLANEOUS) ×2 IMPLANT
NEEDLE SPNL 22GX3.5 QUINCKE BK (NEEDLE) ×2 IMPLANT
PACK CARDIOVASCULAR III (CUSTOM PROCEDURE TRAY) ×2 IMPLANT
POUCH SPECIMEN RETRIEVAL 10MM (ENDOMECHANICALS) IMPLANT
RELOAD 45 VASCULAR/THIN (ENDOMECHANICALS) ×2 IMPLANT
RELOAD ENDO STITCH 2.0 (ENDOMECHANICALS) ×11
RELOAD STAPLE TA45 3.5 REG BLU (ENDOMECHANICALS) ×4 IMPLANT
RELOAD STAPLER BLUE 60MM (STAPLE) ×3 IMPLANT
RELOAD STAPLER GOLD 60MM (STAPLE) ×1 IMPLANT
RELOAD STAPLER WHITE 60MM (STAPLE) ×1 IMPLANT
SCISSORS LAP 5X45 EPIX DISP (ENDOMECHANICALS) ×2 IMPLANT
SET IRRIG TUBING LAPAROSCOPIC (IRRIGATION / IRRIGATOR) ×2 IMPLANT
SHEARS HARMONIC ACE PLUS 45CM (MISCELLANEOUS) ×2 IMPLANT
SLEEVE ADV FIXATION 12X100MM (TROCAR) ×4 IMPLANT
SOLUTION ANTI FOG 6CC (MISCELLANEOUS) ×2 IMPLANT
STAPLER ECHELON LONG 60 440 (INSTRUMENTS) ×2 IMPLANT
STAPLER RELOAD BLUE 60MM (STAPLE) ×6
STAPLER RELOAD GOLD 60MM (STAPLE) ×2
STAPLER RELOAD WHITE 60MM (STAPLE) ×2
SUT MNCRL AB 4-0 PS2 18 (SUTURE) ×2 IMPLANT
SUT RELOAD ENDO STITCH 2 48X1 (ENDOMECHANICALS) ×6
SUT RELOAD ENDO STITCH 2.0 (ENDOMECHANICALS) ×5
SUT SURGIDAC NAB ES-9 0 48 120 (SUTURE) ×2 IMPLANT
SUT VIC AB 2-0 SH 27 (SUTURE) ×1
SUT VIC AB 2-0 SH 27X BRD (SUTURE) ×1 IMPLANT
SUTURE RELOAD END STTCH 2 48X1 (ENDOMECHANICALS) ×6 IMPLANT
SUTURE RELOAD ENDO STITCH 2.0 (ENDOMECHANICALS) ×5 IMPLANT
SYR 10ML ECCENTRIC (SYRINGE) ×2 IMPLANT
SYR 20CC LL (SYRINGE) ×4 IMPLANT
SYR 50ML LL SCALE MARK (SYRINGE) ×2 IMPLANT
TIP RIGID 35CM EVICEL (HEMOSTASIS) ×2 IMPLANT
TOWEL OR 17X26 10 PK STRL BLUE (TOWEL DISPOSABLE) ×2 IMPLANT
TOWEL OR NON WOVEN STRL DISP B (DISPOSABLE) ×2 IMPLANT
TRAY FOLEY BAG SILVER LF 14FR (CATHETERS) ×2 IMPLANT
TROCAR ADV FIXATION 12X100MM (TROCAR) ×2 IMPLANT
TROCAR ADV FIXATION 5X100MM (TROCAR) ×2 IMPLANT
TROCAR BLADELESS OPT 5 100 (ENDOMECHANICALS) ×2 IMPLANT
TROCAR XCEL 12X100 BLDLESS (ENDOMECHANICALS) ×2 IMPLANT
TUBE CALIBRATION LAPBAND (TUBING) IMPLANT
TUBING CONNECTING 10 (TUBING) ×2 IMPLANT
TUBING INSUF HEATED (TUBING) ×2 IMPLANT

## 2018-04-25 NOTE — Transfer of Care (Signed)
Immediate Anesthesia Transfer of Care Note  Patient: Felicia Silva  Procedure(s) Performed: LAPAROSCOPIC ROUX-EN-Y GASTRIC BYPASS WITH UPPER ENDOSCOPY AND ERAS PATHWAY (N/A Abdomen)  Patient Location: PACU  Anesthesia Type:General  Level of Consciousness: drowsy and patient cooperative  Airway & Oxygen Therapy: Patient Spontanous Breathing and Patient connected to face mask  Post-op Assessment: Report given to RN and Post -op Vital signs reviewed and stable  Post vital signs: Reviewed and stable  Last Vitals:  Vitals Value Taken Time  BP 131/64 04/25/2018  3:09 PM  Temp    Pulse 85 04/25/2018  3:10 PM  Resp 18 04/25/2018  3:10 PM  SpO2 99 % 04/25/2018  3:10 PM  Vitals shown include unvalidated device data.  Last Pain:  Vitals:   04/25/18 0927  TempSrc:   PainSc: 0-No pain         Complications: No apparent anesthesia complications

## 2018-04-25 NOTE — Progress Notes (Signed)
PHARMACY CONSULT FOR:  Risk Assessment for Post-Discharge VTE Following Bariatric Surgery  Post-Discharge VTE Risk Assessment: This patient's probability of 30-day post-discharge VTE is increased due to the factors marked:    Female  X  Age >/=60 years    BMI >/=50 kg/m2    CHF    Dyspnea at Rest    Paraplegia  X  Non-gastric-band surgery    Operation Time >/=3 hr    Return to OR     Length of Stay >/= 3 d   Predicted probability of 30-day post-discharge VTE: 0.31%  Other patient-specific factors to consider: N/A   Recommendation for Discharge: No pharmacologic prophylaxis post-discharge  *Will follow patient during hospital stay for return to operating room and length of stay, as these may change recommendations for pharmacologic prophylaxis at discharge.    JERA HEADINGS is a 62 y.o. female who underwent laparoscopic Roux-en-Y gastric bypass with hiatal hernia repair and upper endoscopy on 04/25/2018.    Case start: 1215 Case end: 1457   Allergies  Allergen Reactions  . Shellfish Allergy Itching and Swelling  . Blood-Group Specific Substance     Patient Measurements: Height: 5' 6.5" (168.9 cm) Weight: 268 lb 6.4 oz (121.7 kg) IBW/kg (Calculated) : 60.45 Body mass index is 42.67 kg/m.  No results for input(s): WBC, HGB, HCT, PLT, APTT, CREATININE, LABCREA, CREATININE, CREAT24HRUR, MG, PHOS, ALBUMIN, PROT, ALBUMIN, AST, ALT, ALKPHOS, BILITOT, BILIDIR, IBILI in the last 72 hours. Estimated Creatinine Clearance: 47.2 mL/min (A) (by C-G formula based on SCr of 1.7 mg/dL (H)).    Past Medical History:  Diagnosis Date  . CAD (coronary artery disease), native coronary artery    11/28/16 STEMI PCI DES--LCx, EF normal.   . Diabetes mellitus without complication (HCC)   . Hypertension   . Multiple thyroid nodules    on CT of chest  . STEMI (ST elevation myocardial infarction) (HCC)      Medications Prior to Admission  Medication Sig Dispense Refill Last Dose  .  amLODipine (NORVASC) 5 MG tablet Take 5 mg by mouth at bedtime.  0 04/24/2018 at Unknown time  . atorvastatin (LIPITOR) 80 MG tablet Take 1 tablet (80 mg total) by mouth daily at 6 PM. 30 tablet 6 04/24/2018 at Unknown time  . glipiZIDE (GLUCOTROL) 10 MG tablet Take 10 mg by mouth 2 (two) times daily.   0 04/24/2018 at Unknown time  . metFORMIN (GLUCOPHAGE) 500 MG tablet Take 1,000 mg by mouth 2 (two) times daily.   0 04/24/2018 at Unknown time  . metoprolol tartrate (LOPRESSOR) 25 MG tablet Take 1 tablet (25 mg total) by mouth 2 (two) times daily. 60 tablet 3 04/25/2018 at 0700  . nitroGLYCERIN (NITROSTAT) 0.4 MG SL tablet Place 1 tablet (0.4 mg total) under the tongue every 5 (five) minutes x 3 doses as needed for chest pain. 25 tablet 2 Taking  . olmesartan-hydrochlorothiazide (BENICAR HCT) 40-12.5 MG tablet Take 1 tablet by mouth daily. 90 tablet 3 04/24/2018 at Unknown time  . aspirin 81 MG chewable tablet Chew 81 mg by mouth daily.   More than a month at Unknown time       Jamse Mead 04/25/2018,5:00 PM

## 2018-04-25 NOTE — Op Note (Signed)
Felicia Silva [id number] Aug 22, 1955 04/25/2018  Preoperative diagnosis: roux en Y gastric bypass in progress  Postoperative diagnosis: Same   Procedure: Upper endoscopy   Surgeon: Susy Frizzle B. Daphine Deutscher  M.D., FACS   Anesthesia: Gen.   Indications for procedure: This patient was undergoing a gastric bypass and procedure done to exclude bleeding and leaks.    Description of procedure: The endoscopy was placed in the mouth and into the oropharynx and under endoscopic vision it was advanced to the esophagogastric junction.  The pouch was insufflated and it was small ~5 cm.  No residual hiatal hernia was noted. .   No bleeding or leaks were detected.  The scope was withdrawn without difficulty.     Matt B. Daphine Deutscher, MD, FACS General, Bariatric, & Minimally Invasive Surgery [facility], [place]

## 2018-04-25 NOTE — Anesthesia Preprocedure Evaluation (Signed)
Anesthesia Evaluation  Patient identified by MRN, date of birth, ID band Patient awake    Reviewed: Allergy & Precautions, NPO status , Patient's Chart, lab work & pertinent test results  Airway Mallampati: II  TM Distance: >3 FB Neck ROM: Full    Dental no notable dental hx.    Pulmonary neg pulmonary ROS,    Pulmonary exam normal breath sounds clear to auscultation       Cardiovascular hypertension, + CAD, + Past MI and + Cardiac Stents  Normal cardiovascular exam Rhythm:Regular Rate:Normal     Neuro/Psych negative neurological ROS  negative psych ROS   GI/Hepatic negative GI ROS, Neg liver ROS,   Endo/Other  diabetes  Renal/GU Renal InsufficiencyRenal disease  negative genitourinary   Musculoskeletal negative musculoskeletal ROS (+)   Abdominal   Peds negative pediatric ROS (+)  Hematology negative hematology ROS (+)   Anesthesia Other Findings   Reproductive/Obstetrics negative OB ROS                             Anesthesia Physical Anesthesia Plan  ASA: III  Anesthesia Plan: General   Post-op Pain Management:    Induction: Intravenous  PONV Risk Score and Plan: 4 or greater and Ondansetron, Dexamethasone, Treatment may vary due to age or medical condition and Scopolamine patch - Pre-op  Airway Management Planned: Oral ETT  Additional Equipment:   Intra-op Plan:   Post-operative Plan: Extubation in OR  Informed Consent: I have reviewed the patients History and Physical, chart, labs and discussed the procedure including the risks, benefits and alternatives for the proposed anesthesia with the patient or authorized representative who has indicated his/her understanding and acceptance.   Dental advisory given  Plan Discussed with: CRNA and Surgeon  Anesthesia Plan Comments:         Anesthesia Quick Evaluation

## 2018-04-25 NOTE — Anesthesia Procedure Notes (Signed)
Procedure Name: Intubation Date/Time: 04/25/2018 11:41 AM Performed by: Vanessa Santee, CRNA Pre-anesthesia Checklist: Patient identified, Emergency Drugs available, Suction available and Patient being monitored Patient Re-evaluated:Patient Re-evaluated prior to induction Oxygen Delivery Method: Circle system utilized Preoxygenation: Pre-oxygenation with 100% oxygen Induction Type: IV induction Ventilation: Mask ventilation without difficulty Laryngoscope Size: 2 and Miller Grade View: Grade I Tube type: Oral Number of attempts: 1 Airway Equipment and Method: Stylet Placement Confirmation: ETT inserted through vocal cords under direct vision,  positive ETCO2 and breath sounds checked- equal and bilateral Secured at: 21 cm Tube secured with: Tape Dental Injury: Teeth and Oropharynx as per pre-operative assessment

## 2018-04-25 NOTE — H&P (Signed)
History of Present Illness  The patient is a 62 year old female who presents with obesity. Patient returns to the office accompanied by her husband prior to planned laparoscopic Roux-en-Y gastric bypass next month. Patient is referred by Dr Dwana Melena for consideration for surgical treatment for morbid obesity. Her sister Matthew Saras is a bariatric patient of mine. The patient gives a history of progressive obesity since childhood despite multiple attempts at medical management. In years past she has been able to lose large amounts of weight with diet programs, up to 130 pounds at one point after dieting and exercise about 10 years ago. She finds however that as she has gotten older it is much more difficult to get the weight off. Obesity has been affecting the patient in a number of ways including most importantly to her recent onset of significant comorbidities and increasing medications. Significant co-morbid illnesses have developed including type 2 diabetes mellitus, hypertension and coronary artery disease, status post MI 1 year ago and stent placement. She is followed by Dr Excell Seltzer. She has successfully completed her preoperative workup. She has seen Dr. Excell Seltzer recently and is cleared for surgery. She generally has been feeling well without any significant intercurrent illnesses.   Problem List/Past Medical  HYPERTENSION, BENIGN (I10)  TYPE 2 DIABETES MELLITUS TREATED WITHOUT INSULIN (E11.9)  CORONARY ARTERY DISEASE INVOLVING NATIVE CORONARY ARTERY OF NATIVE HEART WITHOUT ANGINA PECTORIS (I25.10)  MORBID OBESITY, UNSPECIFIED OBESITY TYPE (E66.01)  GASTROESOPHAGEAL REFLUX DISEASE, ESOPHAGITIS PRESENCE NOT SPECIFIED (K21.9)   Past Surgical History  Foot Surgery  Right. Gallbladder Surgery - Laparoscopic  Tonsillectomy  Abdominoplasty   Diagnostic Studies History  Pap Smear  >5 years ago  Allergies  No Known Drug Allergies Allergies Reconciled  Shellfish    Medication History  Olmesartan Medoxomil-HCTZ (Oral) Specific strength unknown - Active. Jardiance (10MG  Tablet, Oral) Active. Aspirin (81MG  Tablet DR, Oral) Active. Atorvastatin Calcium (80MG  Tablet, Oral) Active. Metoprolol Tartrate (25MG  Tablet, Oral) Active. Nitroglycerin (0.4MG  Tab Sublingual, Sublingual) Active. AmLODIPine Besylate (5MG  Tablet, Oral) Active. GlipiZIDE (Oral) Specific strength unknown - Active. MetFORMIN HCl (1000MG  Tablet, Oral) Active. Medications Reconciled  Social History  Caffeine use  Carbonated beverages, Coffee, Tea. No alcohol use  No drug use  Tobacco use  Never smoker.  Family History  Diabetes Mellitus  Brother. Heart Disease  Father. Heart disease in female family member before age 64  Hypertension  Father. Malignant Neoplasm Of Pancreas  Mother.  Pregnancy / Birth History  Age at menarche  11 years. Age of menopause  >64 Gravida  2 Irregular periods  Maternal age  27-25 Para  2  Other Problems  Back Pain  Diabetes Mellitus  Gastroesophageal Reflux Disease  High blood pressure  Hypercholesterolemia  Myocardial infarction   Vitals   Weight: 284.6 lb Height: 66.5in Height was reported by patient. Body Surface Area: 2.34 m Body Mass Index: 45.25 kg/m  Temp.: 75F(Oral)  Pulse: 90 (Regular)  BP: 154/90 (Sitting, Left Arm, Standard)       Physical Exam The physical exam findings are as follows: Note:General: Alert, pleasant morbidly obese Caucasian female in no distress Skin: Warm and dry without rash or infection. HEENT: No palpable masses or thyromegaly. Sclera nonicteric. Lymph nodes: No cervical, supraclavicular, nodes palpable. Lungs: Breath sounds clear and equal. No wheezing or increased work of breathing. Cardiovascular: Regular rate and rhythm, 2/6 systolic murmur.Marland Kitchen No JVD or edema. Peripheral pulses intact. No carotid bruits. Abdomen: Nondistended. Soft and  nontender. No masses palpable. No organomegaly. No  palpable hernias. Extremities: No edema or joint swelling or deformity. No chronic venous stasis changes. Neurologic: Alert and fully oriented. Gait normal. No focal weakness. Psychiatric: Normal mood and affect. Thought content appropriate with normal judgement and insight    Assessment & Plan  OBESITY, MORBID, BMI 40.0-49.9 (E66.01) Impression: Patient with progressive morbid obesity unresponsive to multiple efforts at medical management who presents with a BMI of 43.6 and comorbidities of type 2 diabetes mellitus, hypertension, coronary artery disease and GERD. I believe there would be very significant medical benefit from surgical weight loss. After our discussion of surgical options currently available the patient has decided to proceed with laparoscopic Roux-en-Y gastric bypass due to specific effect on diabetes and concern about worsening GERD. We have discussed the nature of the problem and the risks of remaining morbidly obese. We discussed laparoscopic Roux-en-Y gastric bypass in detail including the nature of the procedure, expected hospitalization and recovery, and major risks of anesthetic complications, bleeding, blood clots and pulmonary emboli, leakage and infection and long-term risks of stricture, ulceration, bowel obstruction, nutritional deficiencies, and failure to lose weight or weight regain. We discussed these problems could lead to death. The patient was given a complete consent form to review and all questions were answered. She has successfully completed her preoperative workup. Ready for surgery. She is given prescriptions for nausea and pain medication and Protonix.

## 2018-04-25 NOTE — Anesthesia Postprocedure Evaluation (Signed)
Anesthesia Post Note  Patient: CHEYRL BULEY  Procedure(s) Performed: LAPAROSCOPIC ROUX-EN-Y GASTRIC BYPASS WITH UPPER ENDOSCOPY AND ERAS PATHWAY (N/A Abdomen)     Patient location during evaluation: PACU Anesthesia Type: General Level of consciousness: awake and alert Pain management: pain level controlled Vital Signs Assessment: post-procedure vital signs reviewed and stable Respiratory status: spontaneous breathing, nonlabored ventilation, respiratory function stable and patient connected to nasal cannula oxygen Cardiovascular status: blood pressure returned to baseline and stable Postop Assessment: no apparent nausea or vomiting Anesthetic complications: no    Last Vitals:  Vitals:   04/25/18 1530 04/25/18 1545  BP: 120/68 121/62  Pulse: 73 71  Resp: 14 14  Temp:    SpO2: 94% 92%    Last Pain:  Vitals:   04/25/18 1545  TempSrc:   PainSc: Asleep                 Jhett Fretwell S

## 2018-04-25 NOTE — Progress Notes (Signed)
Started on ice and water. 

## 2018-04-25 NOTE — Interval H&P Note (Signed)
History and Physical Interval Note:  04/25/2018 10:38 AM  Felicia Silva  has presented today for surgery, with the diagnosis of MORBID OBESITY  The various methods of treatment have been discussed with the patient and family. After consideration of risks, benefits and other options for treatment, the patient has consented to  Procedure(s): LAPAROSCOPIC ROUX-EN-Y GASTRIC BYPASS WITH UPPER ENDOSCOPY AND ERAS PATHWAY (N/A) as a surgical intervention .  The patient's history has been reviewed, patient examined, no change in status, stable for surgery.  I have reviewed the patient's chart and labs.  Questions were answered to the patient's satisfaction.     Lorne Skeens Selma Mink

## 2018-04-25 NOTE — Op Note (Signed)
Preop diagnosis: Morbid obesity  Postop diagnosis: Morbid obesity  Body mass index is 42.67 kg/m.  Surgical procedure: Laparoscopic Roux-en-Y gastric bypass with hiatal hernia repair  Surgeon: Sharlet Salina T.Bria Portales M.D.  Asst.: Luretha Murphy M.D.  Anesthesia: General  Complications:  None  EBL: Minimal  Drains: None  Disposition: PACU in good condition  Description of procedure: Patient is brought to the operating room and general anesthesia induced. She had received preoperative broad-spectrum IV antibiotics and subcutaneous heparin. The abdomen was widely sterilely prepped and draped. Patient timeout was performed and correct patient and procedure confirmed. Access was obtained with a 12 mm Optiview trocar in the left upper quadrant and pneumoperitoneum established without difficulty. Under direct vision 12 mm trocars were placed laterally in the right upper quadrant, right upper quadrant midclavicular line, and to the left and above the umbilicus for the camera port. A 5 mm trocar was placed laterally in the left upper quadrant. The omentum was brought into the upper abdomen and the transverse mesocolon elevated and the ligament of Treitz clearly identified. A 40 cm biliopancreatic limb was then carefully measured from the ligament of Treitz. The small intestine was divided at this point with a single firing of the white load linear stapler. A Penrose drain was sutured to the end of the Roux-en-Y limb for later identification. A 100 cm Roux-en-Y limb was then carefully measured. At this point a side-to-side anastomosis was created between the Roux limb and the end of the biliopancreatic limb. This was accomplished with a single firing of the 45 mm white load linear stapler. The common enterotomy was closed with a running 2-0 Vicryl begun at either end of the enterotomy and tied centrally. The mesenteric defect was then closed with running 2-0 silk. The omentum was then divided with the  harmonic scalpel up towards the transverse colon to allow mobility of the Roux limb toward the gastric pouch. The patient was then placed in steep reversed Trendelenburg. Through a 5 mm subxiphoid site the [facility] retractor was placed and the left lobe of the liver elevated with excellent exposure of the upper stomach and hiatus.  Examination of the hiatus showed a moderate-sized obvious hiatal hernia.  We proceeded with repair.  The gastro hepatic omentum was divided along the avascular area with the harmonic scalpel and the dissection carried over the anterior esophagus at the EG junction.  The right crus was dissected and the retroesophageal  space dissected and the left crus exposed.  A repair was done with interrupted 0 Ethibond sutures approximating the left and right crura down to a normal hiatus with the esophagus lying in the abdominal cavity.  The angle of Hiss was then mobilized with the harmonic scalpel. A 4 cm gastric pouch was then carefully measured along the lesser curve of the stomach. Dissection was carried along the lesser curve at this point with the Harmonic scalpel working carefully back toward the lesser sac at right angles to the lesser curve. The free lesser sac was then entered. After being sure all tubes were removed from the stomach an initial firing of the gold load 60 mm linear stapler was fired at right angles across the lesser curve for about 4 cm. The gastric pouch was further mobilized posteriorly and then the pouch was completed with 2 further firings of the 60 mm blue load linear stapler up through the previously dissected angle of His. It was ensured that the pouch was completely mobilized away from the gastric remnant. This created a  nice tubular 4-5 cm gastric pouch. The staple line of the gastric remnant was then oversewn with 2-0 silk for hemostasis. The Roux limb was then brought up in an antecolic fashion with the candycane facing to the patient's left without undue  tension. The gastrojejunostomy was created with an initial posterior row of 2-0 Vicryl between the Roux limb and the staple line of the gastric pouch. Enterotomies were then made in the gastric pouch and the Roux limb with the harmonic scalpel and at approximately 2-2-1/2 cm anastomosis was created with a single firing of the blue load linear stapler. The staple line was inspected and was intact without bleeding. The common enterotomy was then closed with running 2-0 Vicryl begun at either end and tied centrally. The wall tube was then easily passed through the anastomosis and an outer anterior layer of running 2-0 Vicryl was placed. The Ewald tube was removed. With the outlet of the gastrojejunostomy clamped and under saline irrigation the assistant performed upper endoscopy and with the gastric pouch tensely distended with air there was no evidence of leak. The pouch was desufflated. The Vonita Moss defect was closed with running 2-0 silk. The abdomen was inspected for any evidence of bleeding or bowel injury and everything looked fine. The Nathanson retractor was removed under direct vision after coating the anastomosis with Tisseel tissue sealant. All CO2 was evacuated and trochars removed. Skin incisions were closed with staples. Sponge needle and instrument counts were correct. The patient was taken to the PACU in good condition.     [name] MD, FACS  04/25/2018, 2:59 PM

## 2018-04-26 ENCOUNTER — Encounter (HOSPITAL_COMMUNITY): Payer: Self-pay | Admitting: General Surgery

## 2018-04-26 LAB — CBC WITH DIFFERENTIAL/PLATELET
ABS IMMATURE GRANULOCYTES: 0.07 10*3/uL (ref 0.00–0.07)
BASOS ABS: 0 10*3/uL (ref 0.0–0.1)
Basophils Relative: 0 %
Eosinophils Absolute: 0 10*3/uL (ref 0.0–0.5)
Eosinophils Relative: 0 %
HEMATOCRIT: 34.4 % — AB (ref 36.0–46.0)
Hemoglobin: 10.6 g/dL — ABNORMAL LOW (ref 12.0–15.0)
IMMATURE GRANULOCYTES: 1 %
LYMPHS ABS: 1.1 10*3/uL (ref 0.7–4.0)
LYMPHS PCT: 9 %
MCH: 28.8 pg (ref 26.0–34.0)
MCHC: 30.8 g/dL (ref 30.0–36.0)
MCV: 93.5 fL (ref 80.0–100.0)
MONOS PCT: 6 %
Monocytes Absolute: 0.7 10*3/uL (ref 0.1–1.0)
NEUTROS ABS: 9.8 10*3/uL — AB (ref 1.7–7.7)
NEUTROS PCT: 84 %
Platelets: 299 10*3/uL (ref 150–400)
RBC: 3.68 MIL/uL — ABNORMAL LOW (ref 3.87–5.11)
RDW: 12.8 % (ref 11.5–15.5)
WBC: 11.6 10*3/uL — ABNORMAL HIGH (ref 4.0–10.5)
nRBC: 0 % (ref 0.0–0.2)

## 2018-04-26 LAB — GLUCOSE, CAPILLARY
GLUCOSE-CAPILLARY: 176 mg/dL — AB (ref 70–99)
GLUCOSE-CAPILLARY: 156 mg/dL — AB (ref 70–99)
GLUCOSE-CAPILLARY: 181 mg/dL — AB (ref 70–99)
Glucose-Capillary: 158 mg/dL — ABNORMAL HIGH (ref 70–99)
Glucose-Capillary: 161 mg/dL — ABNORMAL HIGH (ref 70–99)
Glucose-Capillary: 200 mg/dL — ABNORMAL HIGH (ref 70–99)

## 2018-04-26 MED ORDER — METOPROLOL TARTRATE 25 MG PO TABS
25.0000 mg | ORAL_TABLET | Freq: Two times a day (BID) | ORAL | Status: DC
  Administered 2018-04-26 (×2): 25 mg via ORAL
  Filled 2018-04-26 (×2): qty 1

## 2018-04-26 NOTE — Plan of Care (Signed)

## 2018-04-26 NOTE — Progress Notes (Signed)
Patient instructed and educated on starting protein drink, patient shows understanding. We will continue to monitor.

## 2018-04-26 NOTE — Discharge Instructions (Signed)
° ° ° °GASTRIC BYPASS/SLEEVE ° Home Care Instructions ° ° These instructions are to help you care for yourself when you go home. ° °Call: If you have any problems. °• Call [phone] and ask for the surgeon on call °• If you need immediate help, come to the ER at [place].  °• Tell the ER staff that you are a new post-op gastric bypass or gastric sleeve patient °  °Signs and symptoms to report: • Severe vomiting or nausea °o If you cannot keep down clear liquids for longer than 1 day, call your surgeon  °• Abdominal pain that does not get better after taking your pain medication °• Fever over 100.4° F with chills °• Heart beating over 100 beats a minute °• Shortness of breath at rest °• Chest pain °•  Redness, swelling, drainage, or foul odor at incision (surgical) sites °•  If your incisions open or pull apart °• Swelling or pain in calf (lower leg) °• Diarrhea (Loose bowel movements that happen often), frequent watery, uncontrolled bowel movements °• Constipation, (no bowel movements for 3 days) if this happens: Pick one °o Milk of Magnesia, 2 tablespoons by mouth, 3 times a day for 2 days if needed °o Stop taking Milk of Magnesia once you have a bowel movement °o Call your doctor if constipation continues °Or °o Miralax  (instead of Milk of Magnesia) following the label instructions °o Stop taking Miralax once you have a bowel movement °o Call your doctor if constipation continues °• Anything you think is not normal °  °Normal side effects after surgery: • Unable to sleep at night or unable to focus °• Irritability or moody °• Being tearful (crying) or depressed °These are common complaints, possibly related to your anesthesia medications that put you to sleep, stress of surgery, and change in lifestyle.  This usually goes away a few weeks after surgery.  If these feelings continue, call your primary care doctor. °  °Wound Care: You may have surgical glue, steri-strips, or staples over your incisions after  surgery °• Surgical glue:  Looks like a clear film over your incisions and will wear off a little at a time °• Steri-strips: Strips of tape over your incisions. You may notice a yellowish color on the skin under the steri-strips. This is used to make the   steri-strips stick better. Do not pull the steri-strips off - let them fall off °• Staples: Staples may be removed before you leave the hospital °o If you go home with staples, call [facility], [phone] at for an appointment with your surgeon’s nurse to have staples removed 10 days after surgery. °• Showering: You may shower two (2) days after your surgery unless your surgeon tells you differently °o Wash gently around incisions with warm soapy water, rinse well, and gently pat dry  °o No tub baths until staples are removed, steri-strips fall off or glue is gone.  °  °Medications: • Medications should be liquid or crushed if larger than the size of a dime °• Extended release pills (medication that release a little bit at a time through the day) should NOT be crushed or cut. (examples include XL, ER, DR, SR) °• Depending on the size and number of medications you take, you may need to space (take a few throughout the day)/change the time you take your medications so that you do not over-fill your pouch (smaller stomach) °• Make sure you follow-up with your primary care doctor to   make medication changes needed during rapid weight loss and life-style changes °• If you have diabetes, follow up with the doctor that orders your diabetes medication(s) within one week after surgery and check your blood sugar regularly. °• Do not drive while taking prescription pain medication  °• It is ok to take Tylenol by the bottle instructions with your pain medicine or instead of your pain medicine as needed.  DO NOT TAKE NSAIDS (EXAMPLES OF NSAIDS:  IBUPROFREN/ NAPROXEN)  °Diet:                    First 2 Weeks ° You will see the dietician t about two (2) weeks  after your surgery. The dietician will increase the types of foods you can eat if you are handling liquids well: °• If you have severe vomiting or nausea and cannot keep down clear liquids lasting longer than 1 day, call your surgeon @ ([phone]) °Protein Shake °• Drink at least 2 ounces of shake 5-6 times per day °• Each serving of protein shakes (usually 8 - 12 ounces) should have: °o 15 grams of protein  °o And no more than 5 grams of carbohydrate  °• Goal for protein each day: °o Men = 80 grams per day °o Women = 60 grams per day °• Protein powder may be added to fluids such as non-fat milk or Lactaid milk or unsweetened Soy/Almond milk (limit to 35 grams added protein powder per serving) ° °Hydration °• Slowly increase the amount of water and other clear liquids as tolerated (See Acceptable Fluids) °• Slowly increase the amount of protein shake as tolerated  °•  Sip fluids slowly and throughout the day.  Do not use straws. °• May use sugar substitutes in small amounts (no more than 6 - 8 packets per day; i.e. Splenda) ° °Fluid Goal °• The first goal is to drink at least 8 ounces of protein shake/drink per day (or as directed by the nutritionist); some examples of protein shakes are Syntrax Nectar, Adkins Advantage, EAS Edge HP, and Unjury. See handout from pre-op Bariatric Education Class: °o Slowly increase the amount of protein shake you drink as tolerated °o You may find it easier to slowly sip shakes throughout the day °o It is important to get your proteins in first °• Your fluid goal is to drink 64 - 100 ounces of fluid daily °o It may take a few weeks to build up to this °• 32 oz (or more) should be clear liquids  °And  °• 32 oz (or more) should be full liquids (see below for examples) °• Liquids should not contain sugar, caffeine, or carbonation ° °Clear Liquids: °• Water or Sugar-free flavored water (i.e. Fruit H2O, Propel) °• Decaffeinated coffee or tea (sugar-free) °• Crystal Lite, Wyler’s Lite,  Minute Maid Lite °• Sugar-free Jell-O °• Bouillon or broth °• Sugar-free Popsicle:   *Less than 20 calories each; Limit 1 per day ° °Full Liquids: °Protein Shakes/Drinks + 2 choices per day of other full liquids °• Full liquids must be: °o No More Than 15 grams of Carbs per serving  °o No More Than 3 grams of Fat per serving °• Strained low-fat cream soup (except Cream of Potato or Tomato) °• Non-Fat milk °• Fat-free Lactaid Milk °• Unsweetened Soy Or Unsweetened Almond Milk °• Low Sugar yogurt (Dannon Lite & Fit, Greek yogurt; Oikos Triple Zero; Chobani Simply 100; Yoplait 100 calorie Greek - No Fruit on the Bottom) ° °  °Vitamins   and Minerals  Start 1 day after surgery unless otherwise directed by your surgeon  2 Chewable Bariatric Specific Multivitamin / Multimineral Supplement with iron (Example: Bariatric Advantage Multi EA)  Chewable Calcium with Vitamin D-3 (Example: 3 Chewable Calcium Plus 600 with Vitamin D-3) o Take 500 mg three (3) times a day for a total of 1500 mg each day o Do not take all 3 doses of calcium at one time as it may cause constipation, and you can only absorb 500 mg  at a time  o Do not mix multivitamins containing iron with calcium supplements; take 2 hours apart  Menstruating women and those with a history of anemia (a blood disease that causes weakness) may need extra iron o Talk with your doctor to see if you need more iron  Do not stop taking or change any vitamins or minerals until you talk to your dietitian or surgeon  Your Dietitian and/or surgeon must approve all vitamin and mineral supplements   Activity and Exercise: Limit your physical activity as instructed by your doctor.  It is important to continue walking at home.  During this time, use these guidelines:  Do not lift anything greater than ten (10) pounds for at least two (2) weeks  Do not go back to work or drive until Designer, industrial/product says you can  You may have sex when you feel comfortable  o It is  VERY important for female patients to use a reliable birth control method; fertility often increases after surgery  o All hormonal birth control will be ineffective for 30 days after surgery due to medications given during surgery a barrier method must be used. o Do not get pregnant for at least 18 months  Start exercising as soon as your doctor tells you that you can o Make sure your doctor approves any physical activity  Start with a simple walking program  Walk 5-15 minutes each day, 7 days per week.   Slowly increase until you are walking 30-45 minutes per day Consider joining our BELT program. [phone] or email [email or web address]   Special Instructions Things to remember:  Use your CPAP when sleeping if this applies to you   [facility] has two free Bariatric Surgery Support Groups that meet monthly o The 3rd Thursday of each month, 6 pm, [facility]  o The 2nd Friday of each month, 11:45 am in the private dining room in the basement of [facility]  It is very important to keep all follow up appointments with your surgeon, dietitian, primary care physician, and behavioral health practitioner  Routine follow up schedule with your surgeon include appointments at 2-3 weeks, 6-8 weeks, 6 months, and 1 year at a minimum.  Your surgeon may request to see you more often.   o After the first year, please follow up with your bariatric surgeon and dietitian at least once a year in order to maintain best weight loss results [facility]: [phone] [facility]: [phone] Bariatric Nurse Coordinator: [phone]      Reviewed and Endorsed  by [facility] Patient Education Committee, June, 2016 Edits Approved: Aug, 2018    Fingerstick glucose (sugar) goals for home: Before meals: 80-130 mg/dl 2-Hours after meals: less than 180 mg/dl Hemoglobin U2V goal: 7% or less  Symptoms of  Hypoglycemia: Silly, Sweaty, Shaky Check sugar if you have your meter.  If near or less than 70 mg/dl, treat with 1/2 cup juice or  soda or take glucose tablets Check sugar 15 minutes after treatment.  If sugar still near or less than 70 mg/al and symptomatic, treat again and may need a snack with some protein (peanut butter with crackers, etc)

## 2018-04-26 NOTE — Progress Notes (Signed)
Inpatient Diabetes Program Recommendations  AACE/ADA: New Consensus Statement on Inpatient Glycemic Control (2015)  Target Ranges:  Prepandial:   less than 140 mg/dL      Peak postprandial:   less than 180 mg/dL (1-2 hours)      Critically ill patients:  140 - 180 mg/dL   Results for Felicia Silva, Felicia Silva (MRN [id number]) as of 04/26/2018 10:04  Ref. Range 04/25/2018 23:34 04/26/2018 03:42 04/26/2018 07:55  Glucose-Capillary Latest Ref Range: 70 - 99 mg/dL 165 (H) 200 (H) 176 (H)   Results for DHANYA, BOGLE (MRN [id number]) as of 04/26/2018 10:04  Ref. Range 04/20/2018 08:39  Hemoglobin A1C Latest Ref Range: 4.8 - 5.6 % 9.9 (H)   Admit with: Gastric Bypass  History: DM  Home DM Meds: Glipizide 10 mg BID       Metformin 1000 mg BID  Current Orders: Novolog Moderate Correction Scale/ SSI (0-15 units) Q4 hours      Note patient received a total of 20 mg Decadron yesterday around 12pm.  CBGs fairly stable since Midnight.    MD- May want to have pt reduce doses of home Glipizide and Metformin (or temporarily stop) at time of discharge     Met with pt this AM to discuss plan for DM meds for home.  Pt last saw her PCP (Dr. Nevada Crane in [place], [place]) about 2 months ago.  Has follow up annual exam scheduled for November.  Checks CBGs BID at home.  States CBGs usually run in the 140s at home.  Explained to pt that she received steroids yesterday and that these steroids may cause temporary Hyperglycemia.  Asked pt if her PCP wants her to continue her home oral DM meds after surgery.  Pt stated she needed to discuss with her PCP.  Discussed with pt that she may or may not need her home oral DM meds (and if she needs them, she may need a reduction in the dose).  Encouraged pt to check her CBGs consistently at home and to contact her PCP if she has High or Low CBGs at home.  Not sure if Dr. Excell Seltzer will have pt continue oral meds at time of d/c.  Reviewed signs and symptoms of Hypoglycemia with pt  and spouse and reviewed appropriate treatment of HYPO at home.  Pt strongly encouraged to contact PCP for further guidance on oral DM meds for home.       --Will follow patient during hospitalization--  Wyn Quaker RN, MSN, CDE Diabetes Coordinator Inpatient Glycemic Control Team Team Pager: [phone] (8a-5p)

## 2018-04-26 NOTE — Progress Notes (Signed)
Patient ID: Felicia Silva, female   DOB: [birth year], 62 y.o.   MRN: [id number] 1 Day Post-Op   Subjective: Doing well this morning.  Just general abdominal soreness without severe pain.  Has been walking in the hall.  Tolerating water well and also her initial protein shake.  Denies nausea.  Objective: Vital signs in last 24 hours: Temp:  [97.4 F (36.3 C)-98.8 F (37.1 C)] 97.8 F (36.6 C) (10/15 0956) Pulse Rate:  [66-85] 66 (10/15 0956) Resp:  [13-20] 15 (10/15 0956) BP: (114-140)/(58-71) 122/68 (10/15 0956) SpO2:  [92 %-100 %] 96 % (10/15 0956)    Intake/Output from previous day: 10/14 0701 - 10/15 0700 In: 3611.3 [P.O.:600; I.V.:2861.3; IV Piggyback:150] Out: 2395 [Urine:2375; Blood:20] Intake/Output this shift: Total I/O In: 646.7 [P.O.:270; I.V.:329; IV Piggyback:47.6] Out: 500 [Urine:500]  General appearance: alert, cooperative and no distress Resp: clear to auscultation bilaterally GI: normal findings: soft, non-tender Incision/Wound: No erythema or drainage  Lab Results:  Recent Labs    04/25/18 1905 04/26/18 0508  WBC  --  11.6*  HGB 11.5* 10.6*  HCT 37.1 34.4*  PLT  --  299   BMET No results for input(s): NA, K, CL, CO2, GLUCOSE, BUN, CREATININE, CALCIUM in the last 72 hours.   Studies/Results: No results found.  Anti-infectives: Anti-infectives (From admission, onward)   Start     Dose/Rate Route Frequency Ordered Stop   04/25/18 0930  cefoTEtan (CEFOTAN) 2 g in sodium chloride 0.9 % 100 mL IVPB     2 g 200 mL/hr over 30 Minutes Intravenous  Once 04/25/18 0928 04/25/18 1145   04/25/18 0929  sodium chloride 0.9 % with cefoTEtan (CEFOTAN) ADS Med    Note to Pharmacy:  [name]   : cabinet override      04/25/18 0929 04/25/18 1135      Assessment/Plan: s/p Procedure(s): LAPAROSCOPIC ROUX-EN-Y GASTRIC BYPASS WITH UPPER ENDOSCOPY AND ERAS PATHWAY Doing well postoperatively without apparent complication. Slight expected drop in  hemoglobin.  She is starting Lovenox this morning.  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   LOS: 1 day    [name] 04/26/2018

## 2018-04-26 NOTE — Progress Notes (Signed)
Patient alert and oriented, Post op day 1.  Provided support and encouragement.  Encouraged pulmonary toilet, ambulation and small sips of liquids.  Patient completed 12 ounces of bari clear fluid and a 11 ounce protein shake.  No c/o pain or nausea.  All questions answered.  Will continue to monitor.

## 2018-04-27 LAB — CBC WITH DIFFERENTIAL/PLATELET
Abs Immature Granulocytes: 0.04 10*3/uL (ref 0.00–0.07)
BASOS PCT: 1 %
Basophils Absolute: 0.1 10*3/uL (ref 0.0–0.1)
EOS ABS: 0.1 10*3/uL (ref 0.0–0.5)
EOS PCT: 1 %
HEMATOCRIT: 31.6 % — AB (ref 36.0–46.0)
Hemoglobin: 9.7 g/dL — ABNORMAL LOW (ref 12.0–15.0)
IMMATURE GRANULOCYTES: 0 %
LYMPHS ABS: 2.7 10*3/uL (ref 0.7–4.0)
Lymphocytes Relative: 25 %
MCH: 28.4 pg (ref 26.0–34.0)
MCHC: 30.7 g/dL (ref 30.0–36.0)
MCV: 92.4 fL (ref 80.0–100.0)
MONO ABS: 0.9 10*3/uL (ref 0.1–1.0)
MONOS PCT: 8 %
NEUTROS PCT: 65 %
Neutro Abs: 7.1 10*3/uL (ref 1.7–7.7)
PLATELETS: 290 10*3/uL (ref 150–400)
RBC: 3.42 MIL/uL — ABNORMAL LOW (ref 3.87–5.11)
RDW: 13.3 % (ref 11.5–15.5)
WBC: 10.9 10*3/uL — ABNORMAL HIGH (ref 4.0–10.5)
nRBC: 0 % (ref 0.0–0.2)

## 2018-04-27 LAB — GLUCOSE, CAPILLARY
GLUCOSE-CAPILLARY: 133 mg/dL — AB (ref 70–99)
GLUCOSE-CAPILLARY: 101 mg/dL — AB (ref 70–99)

## 2018-04-27 MED ORDER — PREMIER PROTEIN SHAKE: 2.0000 [oz_av] | ORAL | 0 refills | Status: AC

## 2018-04-27 MED ORDER — PANTOPRAZOLE SODIUM 40 MG PO TBEC: 40.0000 mg | DELAYED_RELEASE_TABLET | Freq: Every day | ORAL | 1 refills | Status: DC

## 2018-04-27 MED ORDER — OXYCODONE HCL 5 MG/5ML PO SOLN: 5.0000 mg | ORAL | 0 refills | Status: DC | PRN

## 2018-04-27 NOTE — Progress Notes (Signed)
Discharge instructions reviewed with patient and husband. All questions answered. Patient ambulated to vehicle with belongings

## 2018-04-27 NOTE — Progress Notes (Signed)
Patient alert and oriented, pain is controlled. Patient is tolerating fluids, advanced to protein shake today, patient is tolerating well. Reviewed Gastric Bypass discharge instructions with patient and patient is able to articulate understanding. Provided information on BELT program, Support Group and [facility] outpatient pharmacy. All questions answered, will continue to monitor.   Total fluid intake 860 Per dehydration protocol call back one week postop  

## 2018-04-27 NOTE — Progress Notes (Signed)
Patient ID: Felicia Silva, female   DOB: 1956-02-14, 62 y.o.   MRN: [id number] 2 Days Post-Op   Subjective: No complaints this morning.  No pain.  "Soreness" is improving.  Ambulatory.  Tolerating protein shakes and liquids without difficulty.  Objective: Vital signs in last 24 hours: Temp:  [97.8 F (36.6 C)-98.3 F (36.8 C)] 98.2 F (36.8 C) (10/16 0510) Pulse Rate:  [51-67] 51 (10/16 0510) Resp:  [15-18] 18 (10/16 0510) BP: (102-138)/(65-77) 102/71 (10/16 0510) SpO2:  [95 %-98 %] 97 % (10/16 0510) Last BM Date: 04/25/18  Intake/Output from previous day: 10/15 0701 - 10/16 0700 In: 3217.9 [P.O.:870; I.V.:2247.9; IV Piggyback:99.9] Out: 3600 [Urine:3600] Intake/Output this shift: No intake/output data recorded.  General appearance: alert, cooperative and no distress GI: normal findings: soft, non-tender Incision/Wound: No erythema or drainage  Lab Results:  Recent Labs    04/26/18 0508 04/27/18 0454  WBC 11.6* 10.9*  HGB 10.6* 9.7*  HCT 34.4* 31.6*  PLT 299 290   BMET No results for input(s): NA, K, CL, CO2, GLUCOSE, BUN, CREATININE, CALCIUM in the last 72 hours.   Studies/Results: No results found.  Anti-infectives: Anti-infectives (From admission, onward)   Start     Dose/Rate Route Frequency Ordered Stop   04/25/18 0930  cefoTEtan (CEFOTAN) 2 g in sodium chloride 0.9 % 100 mL IVPB     2 g 200 mL/hr over 30 Minutes Intravenous  Once 04/25/18 0928 04/25/18 1145   04/25/18 0929  sodium chloride 0.9 % with cefoTEtan (CEFOTAN) ADS Med    Note to Pharmacy:  [name]   : cabinet override      04/25/18 0929 04/25/18 1135      Assessment/Plan: s/p Procedure(s): LAPAROSCOPIC ROUX-EN-Y GASTRIC BYPASS WITH UPPER ENDOSCOPY AND ERAS PATHWAY Doing well without apparent complication.  Okay for discharge today.   LOS: 2 days    [name] 04/27/2018

## 2018-04-27 NOTE — Discharge Summary (Signed)
Patient ID: Felicia Silva [id number] 61 y.o. 1955-09-07  04/25/2018  Discharge date and time: 04/27/2018   Admitting Physician: [name]  Discharge Physician: [name]  Admission Diagnoses: MORBID OBESITY  Discharge Diagnoses: Same  Operations: Procedure(s): LAPAROSCOPIC ROUX-EN-Y GASTRIC BYPASS WITH UPPER ENDOSCOPY AND ERAS PATHWAY  Admission Condition: good  Discharged Condition: good  Indication for Admission: Patient with progressive morbid obesity unresponsive to multiple efforts at medical management who presents with a BMI of 43.6 and comorbidities of type 2 diabetes mellitus, hypertension, coronary artery disease and GERD.  After extensive preoperative discussion and work-up detailed elsewhere she is electively admitted for laparoscopic Roux-en-Y gastric bypass for treatment of her morbid obesity.  Hospital Course: On the morning of admission the patient underwent an uneventful laparoscopic Roux-en-Y gastric bypass with repair of a moderate-sized hiatal hernia.  Her postoperative course was uncomplicated.  She had minimal pain.  Started immediately on oral liquids which she tolerated well and advanced to protein shakes.  Hemoglobin decreased slightly and appropriately to 9.7 at discharge.  No clinical evidence of bleeding.  At the time of discharge she is comfortable, ambulatory and tolerating fluids well.   Disposition: Home  Patient Instructions:  Allergies as of 04/27/2018      Reactions   Shellfish Allergy Itching, Swelling   Blood-group Specific Substance       Medication List    TAKE these medications   amLODipine 5 MG tablet Commonly known as:  NORVASC Take 5 mg by mouth at bedtime. Notes to patient:  Monitor Blood Pressure Daily and keep a log for primary care physician.  You may need to make changes to your medications with rapid weight loss.     aspirin 81 MG chewable tablet Chew 81 mg by mouth daily.   atorvastatin 80 MG  tablet Commonly known as:  LIPITOR Take 1 tablet (80 mg total) by mouth daily at 6 PM.   glipiZIDE 10 MG tablet Commonly known as:  GLUCOTROL Take 10 mg by mouth 2 (two) times daily. Notes to patient:  Monitor Blood Sugar Frequently and keep a log for primary care physician, you may need to adjust medication dosage with rapid weight loss.     metFORMIN 500 MG tablet Commonly known as:  GLUCOPHAGE Take 1,000 mg by mouth 2 (two) times daily. Notes to patient:  Monitor Blood Sugar Frequently and keep a log for primary care physician, you may need to adjust medication dosage with rapid weight loss.     metoprolol tartrate 25 MG tablet Commonly known as:  LOPRESSOR Take 1 tablet (25 mg total) by mouth 2 (two) times daily. Notes to patient:  Monitor Blood Pressure Daily and keep a log for primary care physician.  You may need to make changes to your medications with rapid weight loss.     nitroGLYCERIN 0.4 MG SL tablet Commonly known as:  NITROSTAT Place 1 tablet (0.4 mg total) under the tongue every 5 (five) minutes x 3 doses as needed for chest pain.   olmesartan-hydrochlorothiazide 40-12.5 MG tablet Commonly known as:  BENICAR HCT Take 1 tablet by mouth daily. Notes to patient:  Monitor Blood Pressure Daily and keep a log for primary care physician.  You may need to make changes to your medications with rapid weight loss.     oxyCODONE 5 MG/5ML solution Commonly known as:  ROXICODONE Take 5-10 mLs (5-10 mg total) by mouth every 4 (four) hours as needed for moderate pain or severe pain.  pantoprazole 40 MG tablet Commonly known as:  PROTONIX Take 1 tablet (40 mg total) by mouth daily.   protein supplement shake Liqd Commonly known as:  PREMIER PROTEIN Take 59.1 mLs (2 oz total) by mouth every 2 (two) hours.       Activity: activity as tolerated Diet: Bariatric protein shakes Wound Care: none needed  Follow-up:  With Dr. Johna Sheriff in 3 weeks.  Signed: [name]  MD, FACS  04/27/2018, 8:17 AM

## 2018-05-02 ENCOUNTER — Telehealth (HOSPITAL_COMMUNITY): Payer: Self-pay

## 2018-05-02 NOTE — Telephone Encounter (Signed)
Voicemail left with patient to follow up on progress since surgery.  Await return call to discuss post bariatric surgery follow up questions.  See below:   1.  Tell me about your pain and pain management?  2.  Let's talk about fluid intake.  How much total fluid are you taking in?  3.  How much protein have you taken in the last 2 days?  4.  Have you had nausea?  Tell me about when have experienced nausea and what you did to help?  5.  Has the frequency or color changed with your urine?  6.  Tell me what your incisions look like?  7.  Have you been passing gas? BM?  8.  If a problem or question were to arise who would you call?  Do you know contact numbers for [facility], [facility], and [facility]?  9.  How has the walking going?  10.  How are your vitamins and calcium going?  How are you taking them?

## 2018-05-10 ENCOUNTER — Encounter: Payer: BLUE CROSS/BLUE SHIELD | Attending: General Surgery | Admitting: Skilled Nursing Facility1

## 2018-05-10 DIAGNOSIS — E669 Obesity, unspecified: Secondary | ICD-10-CM

## 2018-05-10 DIAGNOSIS — Z713 Dietary counseling and surveillance: Secondary | ICD-10-CM | POA: Insufficient documentation

## 2018-05-11 ENCOUNTER — Encounter: Payer: Self-pay | Admitting: Skilled Nursing Facility1

## 2018-05-11 NOTE — Progress Notes (Signed)
Bariatric Class:  Appt start time: 1530 end time:  1630.  2 Week Post-Operative Nutrition Class  Patient was seen on 05/10/2018 for Post-Operative Nutrition education at [facility].   Pt has Hearing Aids  Pt states she has been checking her blood sugar 4 times a day with numbers averaging 107.   Surgery date: 04/25/2018 Surgery type: RYGB Start weight at [facility]:  Weight today: 255.6  TANITA  BODY COMP RESULTS  05/10/2018   BMI (kg/m^2) 40.6   Fat Mass (lbs) 136.4   Fat Free Mass (lbs) 119.2   Total Body Water (lbs) 87.2   The following the learning objectives were met by the patient during this course:  Identifies Phase 3A (Soft, High Proteins) Dietary Goals and will begin from 2 weeks post-operatively to 2 months post-operatively  Identifies appropriate sources of fluids and proteins   States protein recommendations and appropriate sources post-operatively  Identifies the need for appropriate texture modifications, mastication, and bite sizes when consuming solids  Identifies appropriate multivitamin and calcium sources post-operatively  Describes the need for physical activity post-operatively and will follow MD recommendations  States when to call healthcare provider regarding medication questions or post-operative complications  Handouts given during class include:  Phase 3A: Soft, High Protein Diet Handout  Follow-Up Plan: Patient will follow-up at [facility] in 6 weeks for 2 month post-op nutrition visit for diet advancement per MD.

## 2018-05-13 DIAGNOSIS — Z0000 Encounter for general adult medical examination without abnormal findings: Secondary | ICD-10-CM | POA: Diagnosis not present

## 2018-05-13 DIAGNOSIS — E119 Type 2 diabetes mellitus without complications: Secondary | ICD-10-CM | POA: Diagnosis not present

## 2018-05-16 ENCOUNTER — Telehealth: Payer: Self-pay | Admitting: Dietician

## 2018-05-16 NOTE — Telephone Encounter (Signed)
Dietitian called pt to assess pts diet progression from Liquid to Solid phase which began 2 week post-op bariatric surgery. Pt not available, RD left voicemail.

## 2018-05-18 DIAGNOSIS — E1169 Type 2 diabetes mellitus with other specified complication: Secondary | ICD-10-CM | POA: Diagnosis not present

## 2018-05-18 DIAGNOSIS — I10 Essential (primary) hypertension: Secondary | ICD-10-CM | POA: Diagnosis not present

## 2018-05-18 DIAGNOSIS — I2510 Atherosclerotic heart disease of native coronary artery without angina pectoris: Secondary | ICD-10-CM | POA: Diagnosis not present

## 2018-05-18 DIAGNOSIS — R944 Abnormal results of kidney function studies: Secondary | ICD-10-CM | POA: Diagnosis not present

## 2018-06-15 DIAGNOSIS — E782 Mixed hyperlipidemia: Secondary | ICD-10-CM | POA: Diagnosis not present

## 2018-06-15 DIAGNOSIS — I2510 Atherosclerotic heart disease of native coronary artery without angina pectoris: Secondary | ICD-10-CM | POA: Diagnosis not present

## 2018-06-15 DIAGNOSIS — R944 Abnormal results of kidney function studies: Secondary | ICD-10-CM | POA: Diagnosis not present

## 2018-06-15 DIAGNOSIS — E079 Disorder of thyroid, unspecified: Secondary | ICD-10-CM | POA: Diagnosis not present

## 2018-06-15 DIAGNOSIS — E119 Type 2 diabetes mellitus without complications: Secondary | ICD-10-CM | POA: Diagnosis not present

## 2018-06-15 DIAGNOSIS — I10 Essential (primary) hypertension: Secondary | ICD-10-CM | POA: Diagnosis not present

## 2018-06-21 ENCOUNTER — Encounter: Payer: Self-pay | Admitting: Skilled Nursing Facility1

## 2018-06-21 ENCOUNTER — Encounter: Payer: BLUE CROSS/BLUE SHIELD | Attending: General Surgery | Admitting: Skilled Nursing Facility1

## 2018-06-21 DIAGNOSIS — Z713 Dietary counseling and surveillance: Secondary | ICD-10-CM | POA: Diagnosis not present

## 2018-06-21 DIAGNOSIS — Z6841 Body Mass Index (BMI) 40.0 and over, adult: Secondary | ICD-10-CM

## 2018-06-21 NOTE — Patient Instructions (Signed)
-  Continue to aim for a minimum of 64 fluid ounces 7 days a week with at least 30 ounces being plain water  -Eat non-starchy vegetables 2 times a day 7 days a week  -Start out with soft cooked vegetables today and tomorrow; if tolerated begin to eat raw vegetables or cooked including salads  -Eat your 3 ounces of protein first then start in on your non-starchy vegetables; once you understand how much of your meal leads to satisfaction and not full while still eating 3 ounces of protein and non-starchy vegetables you can eat them in any order   -Continue to aim for 30 minutes of activity at least 5 times a week  

## 2018-06-21 NOTE — Progress Notes (Signed)
Follow-up visit:  8 Weeks Post-Operative RYGB Surgery  Primary concerns today: Post-operative Bariatric Surgery Nutrition Management.  Pt has Hearing Aids  Pt states she has learned to chew her food well. Pt states she has a bowel movement every other day. 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 Pt states she wants to start doing resistance exercises.   Surgery date: 04/25/2018 Surgery type[facility]YGB [facility] Weight today: 241.2 Wt change: 14.4   TANITA  BODY COMP RESULTS  05/10/2018 06/21/2018   BMI (kg/m^2) 40.6 38.3   Fat Mass (lbs) 136.4 117.4   Fat Free Mass (lbs) 119.2 123.8   Total Body Water (lbs) 87.2 89.8   24-hr recall: eating every 3 hours  B (AM): yogurt Snk (AM): protein shake L (PM): cottage cheese with sugar free jello Snk (PM): water D (PM): 5 ounces chicken popcicle or jello  Fluid intake: water, diet green tea, unsweet tea: 55-60 oz Estimated total protein intake: 60+  Medications: off blood pressure and diabetes medicines Supplementation: celebrate and 2 calcium   CBG monitoring: 4 times a day Average CBG per patient: averaging 120 Last patient reported A1c:   Using straws: no Drinking while eating: sipping sometimes with chicken Having you been chewing well:no Chewing/swallowing difficulties: no Changes in vision: no Changes to mood/headaches: no Hair loss/Cahnges to skin/Changes to nails: no Any difficulty focusing or concentrating: no Sweating: no Dizziness/Lightheaded: no Palpitations: no  Carbonated beverages: no N/V/D/C/GAS: some nausea  Abdominal Pain: no Dumping syndrome: no  Recent physical activity:  ADL's  Progress Towards Goal(s):  In progress.  Handouts given during visit include:  Non starchy vegetables + protein    Intervention:  Nutrition counseling. Pts diet was advanced to the next phase now including non starchy vegetables. Due to the bodies need  for essential vitamins, minerals, and fats the pt was educated on the need to consume a certain amount of calories as well as certain nutrients daily. Pt was educated on the need for daily physical activity and to reach a goal of at least 150 minutes of moderate to vigorous physical activity as directed by their physician due to such benefits as increased musculature and improved lab values.   Goals: -Continue to aim for a minimum of 64 fluid ounces 7 days a week with at least 30 ounces being plain water -Eat non-starchy vegetables 2 times a day 7 days a week -Start out with soft cooked vegetables today and tomorrow; if tolerated begin to eat raw vegetables or cooked including salads -Eat your 3 ounces of protein first then start in on your non-starchy vegetables; once you understand how much of your meal leads to satisfaction and not full while still eating 3 ounces of protein and non-starchy vegetables you can eat them in any order  -Continue to aim for 30 minutes of activity at least 5 times a week  Teaching Method Utilized:  Visual Auditory Hands on  Barriers to learning/adherence to lifestyle change: none identified   Demonstrated degree of understanding via:  Teach Back   Monitoring/Evaluation:  Dietary intake, exercise, and body weight. Follow up in 6 months

## 2018-07-11 IMAGING — DX DG CHEST 2V
2 series · 2 of 2 positions shown · non-contrast
Comparison: 11/28/2016

CLINICAL DATA: Morbid obesity, preop bariatric surgery

EXAM:
CHEST - 2 VIEW

[chest pa]
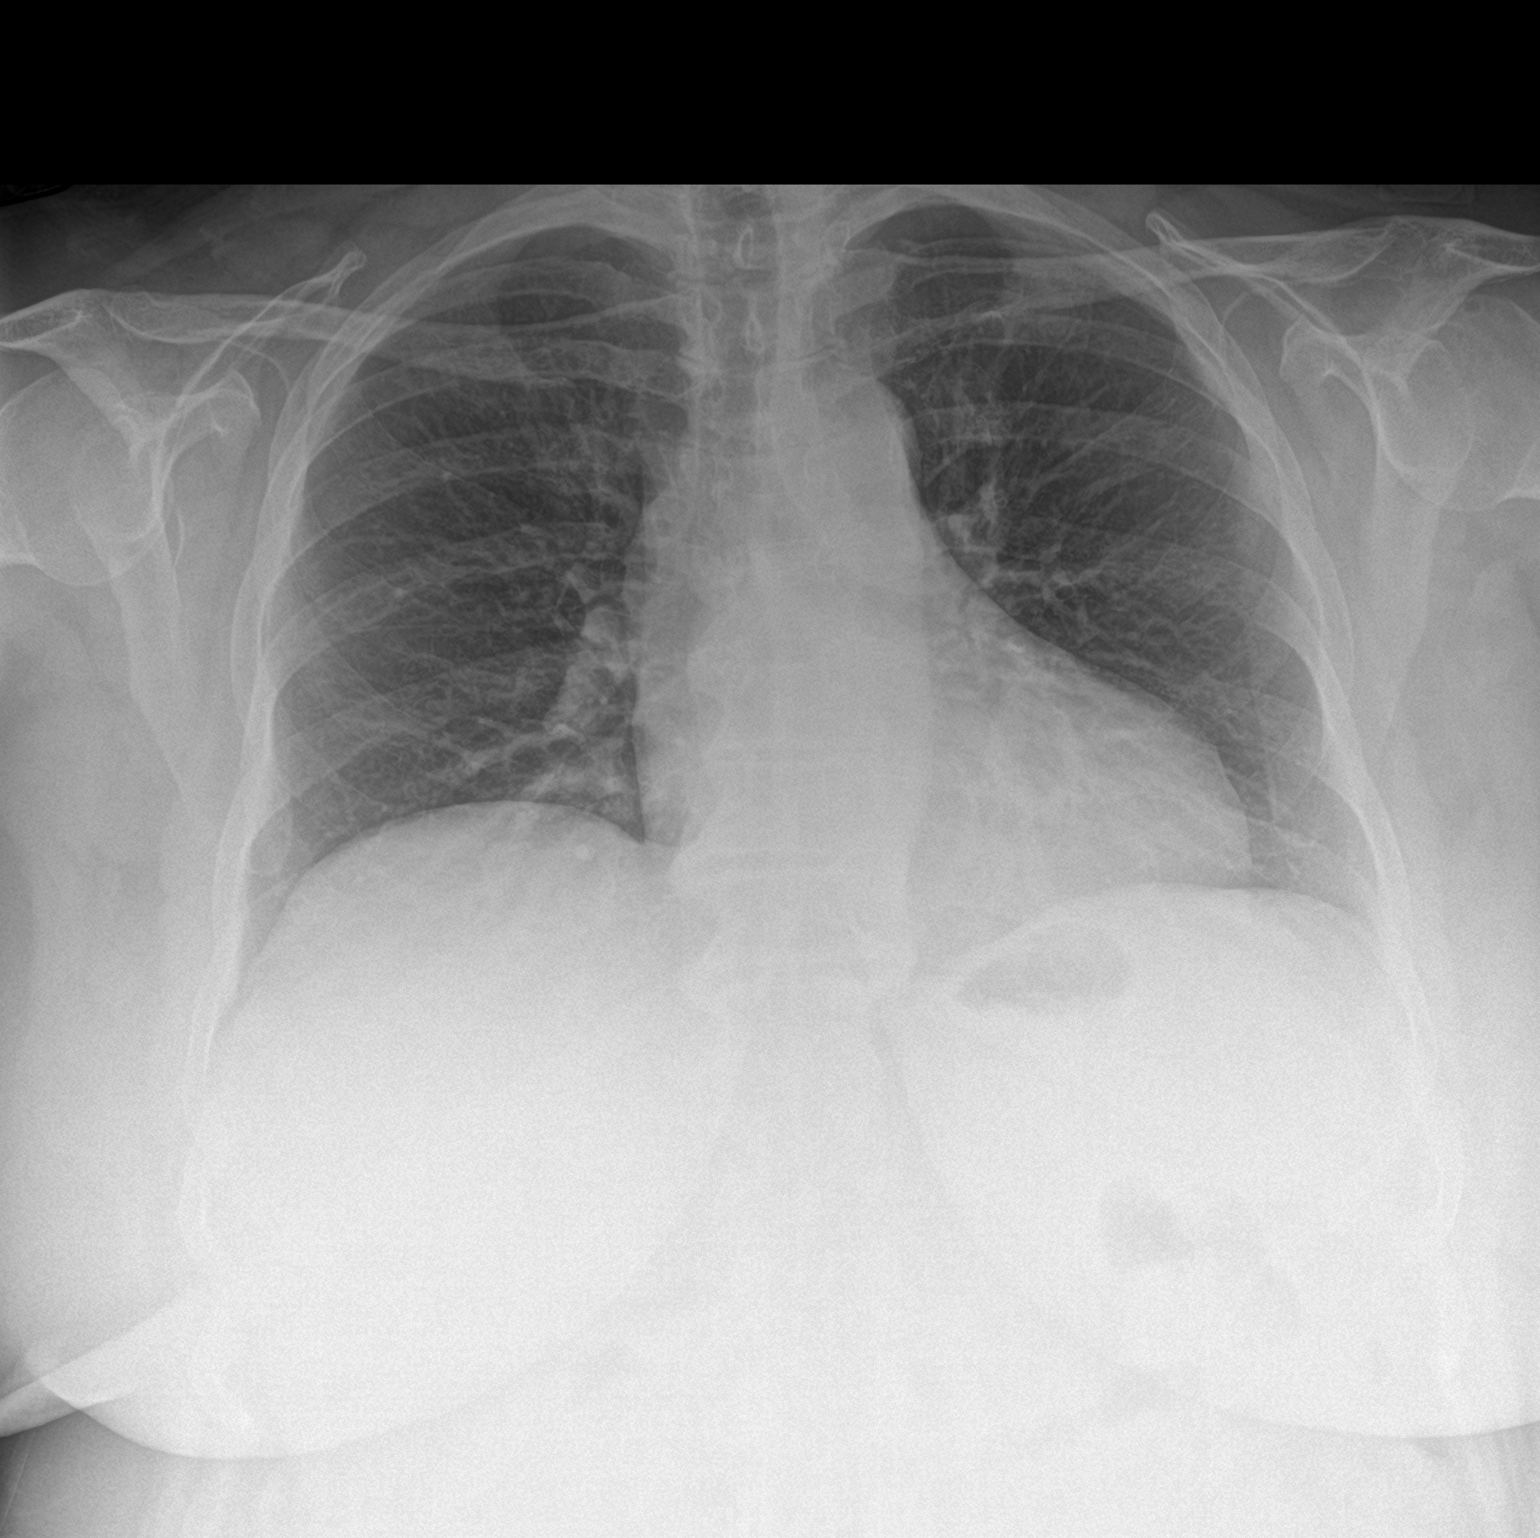

[chest lat]
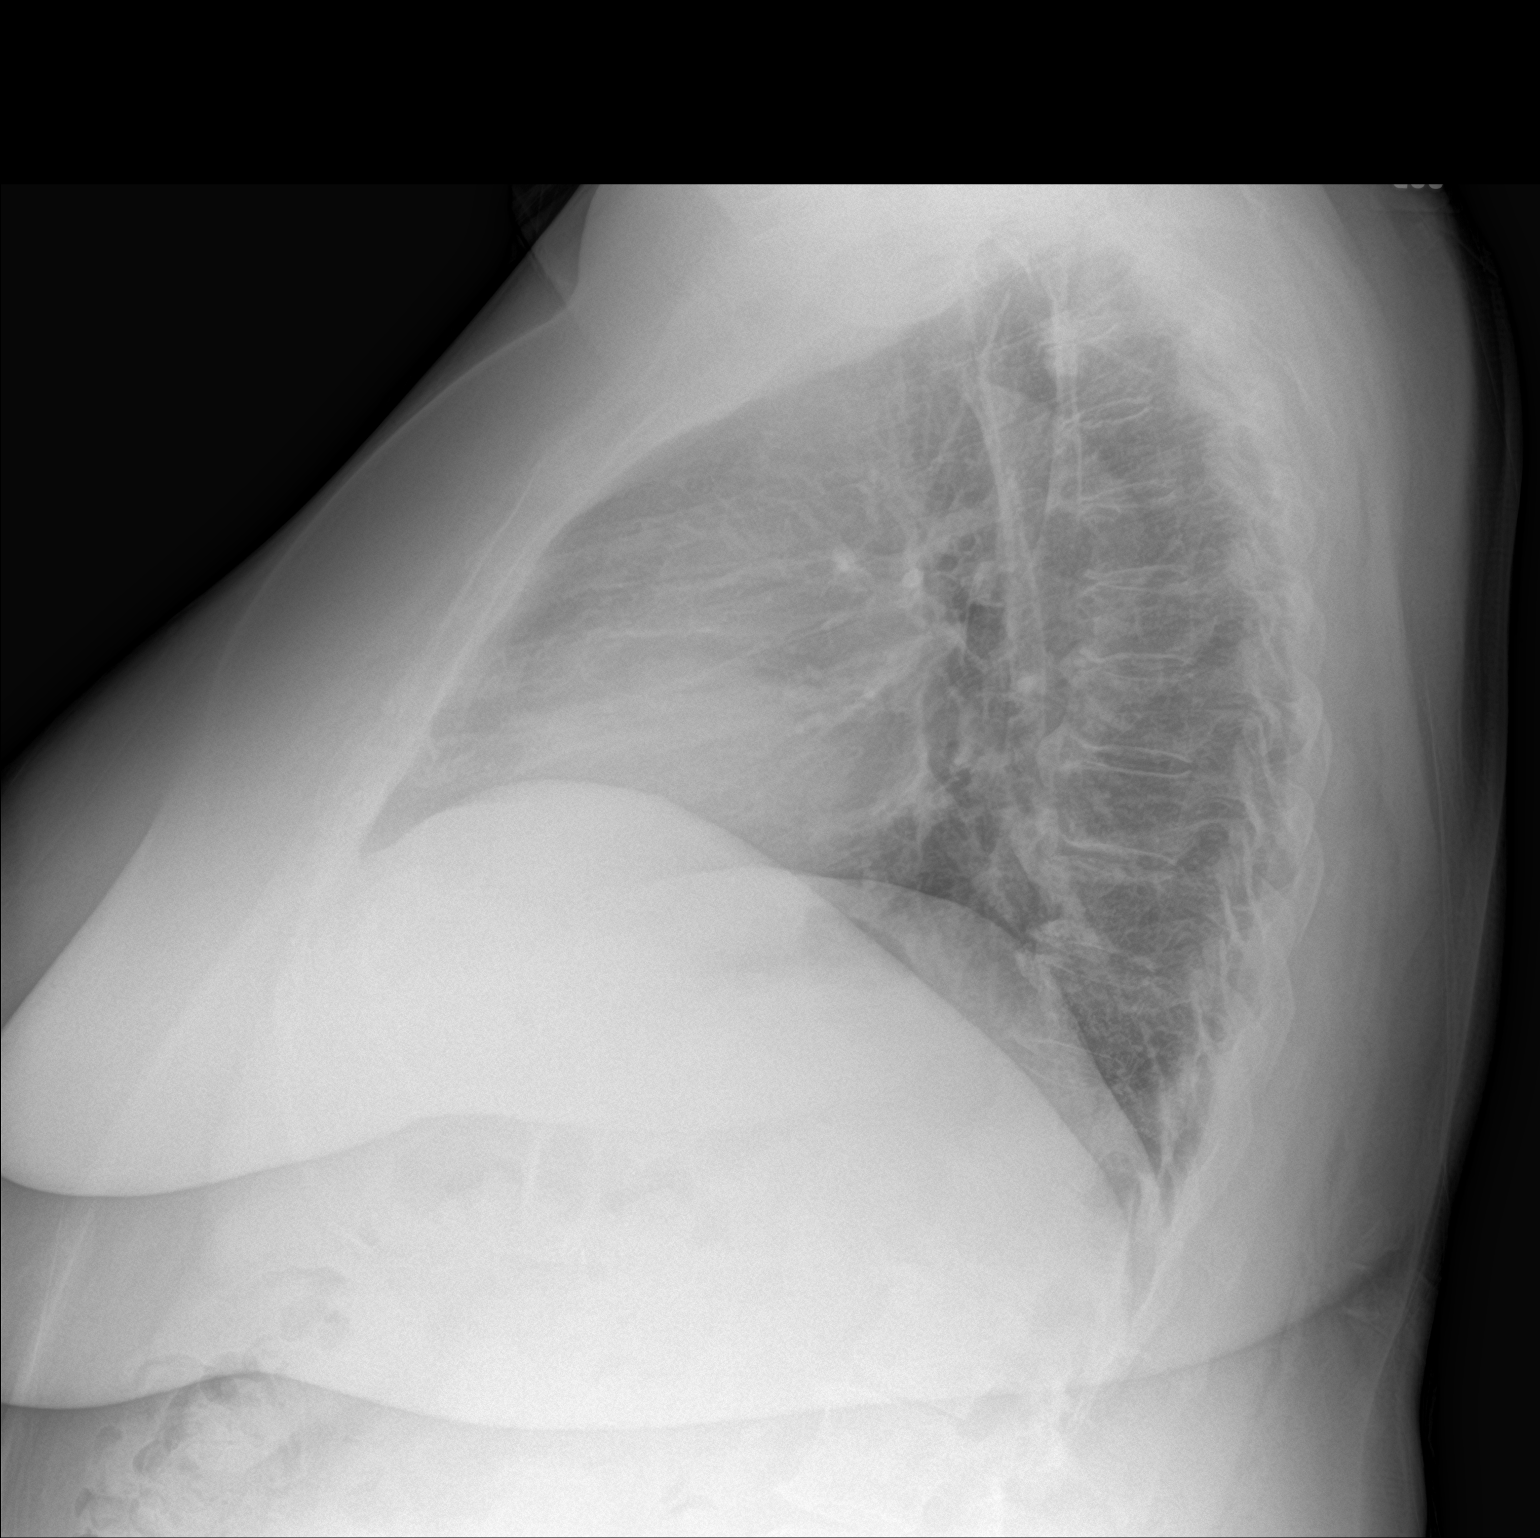

[2 of 2 positions shown; findings below may reference images not displayed]

FINDINGS: Heart is borderline in size. No confluent airspace opacities or
effusions. No acute bony abnormality.
IMPRESSION: No active cardiopulmonary disease.

## 2018-08-26 ENCOUNTER — Encounter (HOSPITAL_COMMUNITY)
Admission: EM | Disposition: A | Discharge status: Home / Self Care | Payer: Self-pay | Source: Home / Self Care | Attending: Emergency Medicine

## 2018-08-26 ENCOUNTER — Observation Stay (HOSPITAL_COMMUNITY)
Admission: EM | Admit: 2018-08-26 | Discharge: 2018-08-27 | Disposition: A | Discharge status: Home / Self Care | Payer: BLUE CROSS/BLUE SHIELD | Attending: Cardiology | Admitting: Cardiology

## 2018-08-26 ENCOUNTER — Other Ambulatory Visit: Payer: Self-pay

## 2018-08-26 ENCOUNTER — Encounter (HOSPITAL_COMMUNITY): Payer: Self-pay | Admitting: Emergency Medicine

## 2018-08-26 ENCOUNTER — Emergency Department (HOSPITAL_COMMUNITY): Payer: BLUE CROSS/BLUE SHIELD

## 2018-08-26 DIAGNOSIS — E1169 Type 2 diabetes mellitus with other specified complication: Secondary | ICD-10-CM | POA: Diagnosis present

## 2018-08-26 DIAGNOSIS — Z79899 Other long term (current) drug therapy: Secondary | ICD-10-CM | POA: Diagnosis not present

## 2018-08-26 DIAGNOSIS — I129 Hypertensive chronic kidney disease with stage 1 through stage 4 chronic kidney disease, or unspecified chronic kidney disease: Secondary | ICD-10-CM | POA: Insufficient documentation

## 2018-08-26 DIAGNOSIS — Z955 Presence of coronary angioplasty implant and graft: Secondary | ICD-10-CM | POA: Insufficient documentation

## 2018-08-26 DIAGNOSIS — E042 Nontoxic multinodular goiter: Secondary | ICD-10-CM | POA: Diagnosis not present

## 2018-08-26 DIAGNOSIS — Z9884 Bariatric surgery status: Secondary | ICD-10-CM | POA: Insufficient documentation

## 2018-08-26 DIAGNOSIS — E1122 Type 2 diabetes mellitus with diabetic chronic kidney disease: Secondary | ICD-10-CM | POA: Diagnosis not present

## 2018-08-26 DIAGNOSIS — E119 Type 2 diabetes mellitus without complications: Secondary | ICD-10-CM

## 2018-08-26 DIAGNOSIS — Z8249 Family history of ischemic heart disease and other diseases of the circulatory system: Secondary | ICD-10-CM | POA: Insufficient documentation

## 2018-08-26 DIAGNOSIS — I252 Old myocardial infarction: Secondary | ICD-10-CM | POA: Insufficient documentation

## 2018-08-26 DIAGNOSIS — Z7982 Long term (current) use of aspirin: Secondary | ICD-10-CM | POA: Insufficient documentation

## 2018-08-26 DIAGNOSIS — Z6841 Body Mass Index (BMI) 40.0 and over, adult: Secondary | ICD-10-CM | POA: Insufficient documentation

## 2018-08-26 DIAGNOSIS — E785 Hyperlipidemia, unspecified: Secondary | ICD-10-CM | POA: Diagnosis not present

## 2018-08-26 DIAGNOSIS — I2510 Atherosclerotic heart disease of native coronary artery without angina pectoris: Secondary | ICD-10-CM | POA: Diagnosis not present

## 2018-08-26 DIAGNOSIS — I10 Essential (primary) hypertension: Secondary | ICD-10-CM | POA: Diagnosis present

## 2018-08-26 DIAGNOSIS — R001 Bradycardia, unspecified: Secondary | ICD-10-CM

## 2018-08-26 DIAGNOSIS — R079 Chest pain, unspecified: Secondary | ICD-10-CM | POA: Diagnosis not present

## 2018-08-26 DIAGNOSIS — I25110 Atherosclerotic heart disease of native coronary artery with unstable angina pectoris: Secondary | ICD-10-CM | POA: Diagnosis not present

## 2018-08-26 DIAGNOSIS — I200 Unstable angina: Secondary | ICD-10-CM | POA: Diagnosis not present

## 2018-08-26 DIAGNOSIS — N182 Chronic kidney disease, stage 2 (mild): Secondary | ICD-10-CM | POA: Diagnosis not present

## 2018-08-26 DIAGNOSIS — R072 Precordial pain: Secondary | ICD-10-CM | POA: Insufficient documentation

## 2018-08-26 HISTORY — DX: Other complications of anesthesia, initial encounter: T88.59XA

## 2018-08-26 HISTORY — DX: Hyperlipidemia, unspecified: E78.5

## 2018-08-26 HISTORY — PX: LEFT HEART CATH AND CORONARY ANGIOGRAPHY: CATH118249

## 2018-08-26 HISTORY — DX: Nausea with vomiting, unspecified: R11.2

## 2018-08-26 HISTORY — DX: Type 2 diabetes mellitus with other specified complication: E11.69

## 2018-08-26 HISTORY — DX: Other specified postprocedural states: Z98.890

## 2018-08-26 HISTORY — DX: Adverse effect of unspecified anesthetic, initial encounter: T41.45XA

## 2018-08-26 LAB — LIPID PANEL
Cholesterol: 96 mg/dL (ref 0–200)
HDL: 38 mg/dL — AB (ref >40)
LDL Cholesterol: 49 mg/dL (ref 0–99)
Total CHOL/HDL Ratio: 2.5 RATIO
Triglycerides: 44 mg/dL (ref <150)
VLDL: 9 mg/dL (ref 0–40)

## 2018-08-26 LAB — CBC
HEMATOCRIT: 38.7 % (ref 36.0–46.0)
HEMOGLOBIN: 12.3 g/dL (ref 12.0–15.0)
MCH: 28 pg (ref 26.0–34.0)
MCHC: 31.8 g/dL (ref 30.0–36.0)
MCV: 88.2 fL (ref 80.0–100.0)
NRBC: 0 % (ref 0.0–0.2)
Platelets: 233 10*3/uL (ref 150–400)
RBC: 4.39 MIL/uL (ref 3.87–5.11)
RDW: 13.3 % (ref 11.5–15.5)
WBC: 7.2 10*3/uL (ref 4.0–10.5)

## 2018-08-26 LAB — BASIC METABOLIC PANEL WITH GFR
Anion gap: 12 (ref 5–15)
BUN: 17 mg/dL (ref 8–23)
CO2: 21 mmol/L — ABNORMAL LOW (ref 22–32)
Calcium: 9.4 mg/dL (ref 8.9–10.3)
Chloride: 105 mmol/L (ref 98–111)
Creatinine, Ser: 1.14 mg/dL — ABNORMAL HIGH (ref 0.44–1.00)
GFR calc Af Amer: >60 mL/min
GFR calc non Af Amer: 52 mL/min — ABNORMAL LOW
Glucose, Bld: 156 mg/dL — ABNORMAL HIGH (ref 70–99)
Potassium: 3.7 mmol/L (ref 3.5–5.1)
Sodium: 138 mmol/L (ref 135–145)

## 2018-08-26 LAB — HEPATIC FUNCTION PANEL
ALT: 171 U/L — ABNORMAL HIGH (ref 0–44)
AST: 318 U/L — ABNORMAL HIGH (ref 15–41)
Albumin: 3.4 g/dL — ABNORMAL LOW (ref 3.5–5.0)
Alkaline Phosphatase: 171 U/L — ABNORMAL HIGH (ref 38–126)
Bilirubin, Direct: 0.4 mg/dL — ABNORMAL HIGH (ref 0.0–0.2)
Indirect Bilirubin: 0.9 mg/dL (ref 0.3–0.9)
TOTAL PROTEIN: 6.1 g/dL — AB (ref 6.5–8.1)
Total Bilirubin: 1.3 mg/dL — ABNORMAL HIGH (ref 0.3–1.2)

## 2018-08-26 LAB — HEMOGLOBIN A1C
Hgb A1c MFr Bld: 8.1 % — ABNORMAL HIGH (ref 4.8–5.6)
Mean Plasma Glucose: 185.77 mg/dL

## 2018-08-26 LAB — PROTIME-INR
INR: 1.02
Prothrombin Time: 13.3 s (ref 11.4–15.2)

## 2018-08-26 LAB — TROPONIN I
Troponin I: <0.03 ng/mL (ref <0.03)
Troponin I: <0.03 ng/mL (ref <0.03)

## 2018-08-26 LAB — CBG MONITORING, ED: Glucose-Capillary: 134 mg/dL — ABNORMAL HIGH (ref 70–99)

## 2018-08-26 LAB — GLUCOSE, CAPILLARY: Glucose-Capillary: 175 mg/dL — ABNORMAL HIGH (ref 70–99)

## 2018-08-26 SURGERY — LEFT HEART CATH AND CORONARY ANGIOGRAPHY
Anesthesia: LOCAL

## 2018-08-26 MED ORDER — MIDAZOLAM HCL 2 MG/2ML IJ SOLN
INTRAMUSCULAR | Status: AC
  Filled 2018-08-26: qty 2

## 2018-08-26 MED ORDER — SODIUM CHLORIDE 0.9 % IV SOLN: 250.0000 mL | INTRAVENOUS | Status: DC | PRN

## 2018-08-26 MED ORDER — FENTANYL CITRATE (PF) 100 MCG/2ML IJ SOLN
INTRAMUSCULAR | Status: DC | PRN
  Administered 2018-08-26 (×2): 25 ug via INTRAVENOUS

## 2018-08-26 MED ORDER — SODIUM CHLORIDE 0.9% FLUSH
3.0000 mL | INTRAVENOUS | Status: DC | PRN
  Administered 2018-08-27: 09:00:00 3 mL via INTRAVENOUS
  Filled 2018-08-26: qty 3

## 2018-08-26 MED ORDER — LIDOCAINE HCL (PF) 1 % IJ SOLN
INTRAMUSCULAR | Status: AC
  Filled 2018-08-26: qty 30

## 2018-08-26 MED ORDER — ASPIRIN 81 MG PO CHEW
81.0000 mg | CHEWABLE_TABLET | Freq: Every day | ORAL | Status: DC
  Administered 2018-08-27: 81 mg via ORAL
  Filled 2018-08-26: qty 1

## 2018-08-26 MED ORDER — ZOLPIDEM TARTRATE 5 MG PO TABS: 5.0000 mg | ORAL_TABLET | Freq: Every evening | ORAL | Status: DC | PRN

## 2018-08-26 MED ORDER — HEPARIN BOLUS VIA INFUSION
4000.0000 [IU] | Freq: Once | INTRAVENOUS | Status: AC
  Administered 2018-08-26: 4000 [IU] via INTRAVENOUS
  Filled 2018-08-26: qty 4000

## 2018-08-26 MED ORDER — SODIUM CHLORIDE 0.9% FLUSH: 3.0000 mL | Freq: Two times a day (BID) | INTRAVENOUS | Status: DC

## 2018-08-26 MED ORDER — HEPARIN SODIUM (PORCINE) 1000 UNIT/ML IJ SOLN
INTRAMUSCULAR | Status: DC | PRN
  Administered 2018-08-26: 5000 [IU] via INTRAVENOUS

## 2018-08-26 MED ORDER — VERAPAMIL HCL 2.5 MG/ML IV SOLN
INTRAVENOUS | Status: DC | PRN
  Administered 2018-08-26: 10 mL via INTRA_ARTERIAL

## 2018-08-26 MED ORDER — HEPARIN (PORCINE) IN NACL 1000-0.9 UT/500ML-% IV SOLN
INTRAVENOUS | Status: DC | PRN
  Administered 2018-08-26: 500 mL

## 2018-08-26 MED ORDER — ASPIRIN 81 MG PO CHEW
324.0000 mg | CHEWABLE_TABLET | Freq: Once | ORAL | Status: AC
  Administered 2018-08-26: 243 mg via ORAL
  Filled 2018-08-26: qty 4

## 2018-08-26 MED ORDER — ALPRAZOLAM 0.25 MG PO TABS: 0.2500 mg | ORAL_TABLET | Freq: Two times a day (BID) | ORAL | Status: DC | PRN

## 2018-08-26 MED ORDER — ADULT MULTIVITAMIN W/MINERALS CH
1.0000 | ORAL_TABLET | Freq: Every day | ORAL | Status: DC
  Administered 2018-08-27: 1 via ORAL
  Filled 2018-08-26: qty 1

## 2018-08-26 MED ORDER — SODIUM CHLORIDE 0.9 % IV SOLN: INTRAVENOUS | Status: AC

## 2018-08-26 MED ORDER — ONDANSETRON HCL 4 MG/2ML IJ SOLN: 4.0000 mg | Freq: Four times a day (QID) | INTRAMUSCULAR | Status: DC | PRN

## 2018-08-26 MED ORDER — HEPARIN SODIUM (PORCINE) 1000 UNIT/ML IJ SOLN
INTRAMUSCULAR | Status: AC
  Filled 2018-08-26: qty 1

## 2018-08-26 MED ORDER — ACETAMINOPHEN 325 MG PO TABS: 650.0000 mg | ORAL_TABLET | ORAL | Status: DC | PRN

## 2018-08-26 MED ORDER — SODIUM CHLORIDE 0.9% FLUSH
3.0000 mL | Freq: Two times a day (BID) | INTRAVENOUS | Status: DC
  Administered 2018-08-26: 21:00:00 3 mL via INTRAVENOUS

## 2018-08-26 MED ORDER — NITROGLYCERIN 0.4 MG SL SUBL: 0.4000 mg | SUBLINGUAL_TABLET | SUBLINGUAL | Status: DC | PRN

## 2018-08-26 MED ORDER — HEPARIN (PORCINE) 25000 UT/250ML-% IV SOLN
1200.0000 [IU]/h | INTRAVENOUS | Status: DC
  Administered 2018-08-26: 1200 [IU]/h via INTRAVENOUS
  Filled 2018-08-26: qty 250

## 2018-08-26 MED ORDER — NITROGLYCERIN 2 % TD OINT
1.0000 [in_us] | TOPICAL_OINTMENT | Freq: Once | TRANSDERMAL | Status: AC
  Administered 2018-08-26: 1 [in_us] via TOPICAL
  Filled 2018-08-26: qty 1

## 2018-08-26 MED ORDER — ATORVASTATIN CALCIUM 80 MG PO TABS
80.0000 mg | ORAL_TABLET | Freq: Every day | ORAL | Status: DC
  Administered 2018-08-26: 80 mg via ORAL
  Filled 2018-08-26: qty 1

## 2018-08-26 MED ORDER — METOPROLOL TARTRATE 25 MG PO TABS
25.0000 mg | ORAL_TABLET | Freq: Two times a day (BID) | ORAL | Status: DC
  Administered 2018-08-26 – 2018-08-27 (×2): 25 mg via ORAL
  Filled 2018-08-26 (×2): qty 1

## 2018-08-26 MED ORDER — MIDAZOLAM HCL 2 MG/2ML IJ SOLN
INTRAMUSCULAR | Status: DC | PRN
  Administered 2018-08-26 (×2): 1 mg via INTRAVENOUS

## 2018-08-26 MED ORDER — SODIUM CHLORIDE 0.9 % IV SOLN
INTRAVENOUS | Status: DC
  Administered 2018-08-26: 12:00:00 via INTRAVENOUS

## 2018-08-26 MED ORDER — VERAPAMIL HCL 2.5 MG/ML IV SOLN
INTRAVENOUS | Status: AC
  Filled 2018-08-26: qty 2

## 2018-08-26 MED ORDER — HEPARIN (PORCINE) IN NACL 1000-0.9 UT/500ML-% IV SOLN
INTRAVENOUS | Status: AC
  Filled 2018-08-26: qty 1000

## 2018-08-26 MED ORDER — FENTANYL CITRATE (PF) 100 MCG/2ML IJ SOLN
INTRAMUSCULAR | Status: AC
  Filled 2018-08-26: qty 2

## 2018-08-26 MED ORDER — CALCIUM CARBONATE ANTACID 500 MG PO CHEW
1.0000 | CHEWABLE_TABLET | Freq: Every day | ORAL | Status: DC
  Administered 2018-08-27: 200 mg via ORAL
  Filled 2018-08-26: qty 1

## 2018-08-26 MED ORDER — LIDOCAINE HCL (PF) 1 % IJ SOLN
INTRAMUSCULAR | Status: DC | PRN
  Administered 2018-08-26: 2 mL

## 2018-08-26 MED ORDER — SODIUM CHLORIDE 0.9% FLUSH: 3.0000 mL | INTRAVENOUS | Status: DC | PRN

## 2018-08-26 SURGICAL SUPPLY — 10 items
CATH 5FR JL3.5 JR4 ANG PIG MP (CATHETERS) ×2 IMPLANT
DEVICE RAD COMP TR BAND LRG (VASCULAR PRODUCTS) ×2 IMPLANT
GLIDESHEATH SLEND SS 6F .021 (SHEATH) ×2 IMPLANT
GUIDEWIRE INQWIRE 1.5J.035X260 (WIRE) ×1 IMPLANT
INQWIRE 1.5J .035X260CM (WIRE) ×2
KIT HEART LEFT (KITS) ×2 IMPLANT
PACK CARDIAC CATHETERIZATION (CUSTOM PROCEDURE TRAY) ×2 IMPLANT
SYR MEDRAD MARK 7 150ML (SYRINGE) ×2 IMPLANT
TRANSDUCER W/STOPCOCK (MISCELLANEOUS) ×2 IMPLANT
TUBING CIL FLEX 10 FLL-RA (TUBING) ×2 IMPLANT

## 2018-08-26 NOTE — ED Notes (Signed)
Patient transported to CT 

## 2018-08-26 NOTE — Progress Notes (Signed)
ANTICOAGULATION CONSULT NOTE - Initial Consult  Pharmacy Consult for Heparin Indication: chest pain/ACS  Allergies  Allergen Reactions  . Shellfish Allergy Itching and Swelling  . Blood-Group Specific Substance     Patient Measurements: Weight: 222 lb (100.7 kg)  Vital Signs: Temp: 98.2 F (36.8 C) (02/14 0711) Temp Source: Oral (02/14 0711) BP: 135/76 (02/14 1200) Pulse Rate: 52 (02/14 1200)  Labs: Recent Labs    08/26/18 0720  HGB 12.3  HCT 38.7  PLT 233  LABPROT 13.3  INR 1.02  CREATININE 1.14*  TROPONINI <0.03    Estimated Creatinine Clearance: 62.7 mL/min (A) (by C-G formula based on SCr of 1.14 mg/dL (H)).   Medical History: Past Medical History:  Diagnosis Date  . CAD (coronary artery disease), native coronary artery    11/28/16 STEMI PCI DES--LCx, EF normal.   . Diabetes mellitus without complication (HCC)   . Hyperlipidemia associated with type 2 diabetes mellitus (HCC), goal LDL < 70 11/30/2016  . Hypertension   . Multiple thyroid nodules    on CT of chest  . STEMI (ST elevation myocardial infarction) (HCC) 11/2016   DES CFX    Assessment: 63 yo female sudden onset of SSCP, pressure, 5/10. Pharmacy is consulted for heparin dosing  Goal of Therapy:  Heparin level 0.3-0.7 units/ml Monitor platelets by anticoagulation protocol: Yes   Plan:  Give 4000 units bolus x 1 Start heparin infusion at 1200 units/hr Check anti-Xa level in 8 hours and daily while on heparin Continue to monitor H&H and platelets  Jeanella Cara, PharmD, [facility] Clinical Pharmacist Please see AMION for all Pharmacists' Contact Phone Numbers 08/26/2018, 12:44 PM

## 2018-08-26 NOTE — ED Notes (Signed)
Pt reports taking 2 nitro last night with relief.

## 2018-08-26 NOTE — ED Notes (Signed)
cbg 134

## 2018-08-26 NOTE — ED Triage Notes (Signed)
Pt here from home with c/o chest pain that started at 4 am some slight nausea , no sob , pt has history of MI with 1 stent

## 2018-08-26 NOTE — H&P (Signed)
Cardiology Admission History and Physical:   Patient ID: Felicia Silva; MRN: [id number]; DOB: [birth year]   Admission date: 08/26/2018  Primary Care Provider: Benita StabileHall, John Z, MD Primary Cardiologist: Tonny BollmanMichael Cooper, MD 11/2017 Primary Electrophysiologist:  None  Chief Complaint:  Chest pain  Patient Profile:   Felicia Silva is a 63 y.o. female with a history of STEMI 2018 s/p DES CFX w/ med rx for 50% LAD, HOH, DM, HTN, HLD, obesity.  History of Present Illness:   Felicia Silva was still in bed but awake this am. She had onset of SSCP, pressure, 5/10. No associated SOB, N&V or diaphoresis. She took SL NTG x 1, pain was relieved in 5" or so. She took am meds about 6 am, BB and ASA.   She was concerned, but did not call 911 because sx resolved. However, she had another episode of pain, also relieved by SL NTG x 1. Her husband came home.   She developed intermittent CP, same sx but they would resolved spontaneously. She felt a soreness between her shoulder blades. Was slightly nauseated, that also resolved. She had 5-10 episodes of the intermittent CP.   Came to the ER, was given 324 mg ASA and NTG paste>>pain-free now.   The sx reminded her of her MI sx, this is the first time she's had them since the MI.    Past Medical History:  Diagnosis Date  . CAD (coronary artery disease), native coronary artery    11/28/16 STEMI PCI DES--LCx, EF normal.   . Diabetes mellitus without complication (HCC)   . Hyperlipidemia associated with type 2 diabetes mellitus (HCC), goal LDL < 70 11/30/2016  . Hypertension   . Multiple thyroid nodules    on CT of chest  . STEMI (ST elevation myocardial infarction) (HCC) 11/2016   DES CFX    Past Surgical History:  Procedure Laterality Date  . ABDOMINAL SURGERY    . CHOLECYSTECTOMY    . CORONARY STENT INTERVENTION N/A 11/28/2016   Procedure: Coronary Stent Intervention;  Surgeon: Tonny Bollmanooper, Michael, MD;  [facility];  Service:  Cardiovascular;  Laterality: N/A;  . 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   Procedure: LAPAROSCOPIC ROUX-EN-Y GASTRIC BYPASS WITH UPPER ENDOSCOPY AND ERAS PATHWAY;  Surgeon: Glenna FellowsHoxworth, Benjamin, MD;  Location: [facility] ORS;  Service: General;  Laterality: N/A;  . HERNIA REPAIR    . LEFT HEART CATH AND CORONARY ANGIOGRAPHY N/A 11/28/2016   Procedure: Left Heart Cath and Coronary Angiography;  Surgeon: Tonny Bollmanooper, Michael, MD;  [facility];  Service: Cardiovascular;  Laterality: N/A;  . TONSILLECTOMY       Medications Prior to Admission: Prior to Admission medications   Medication Sig Start Date End Date Taking? Authorizing Provider  acetaminophen-codeine (TYLENOL #3) 300-30 MG tablet Take 1 tablet by mouth as needed. 08/22/18  Yes [provider]  amLODipine (NORVASC) 5 MG tablet Take 5 mg by mouth at bedtime. 11/27/16  Yes [provider]  aspirin 81 MG chewable tablet Chew 81 mg by mouth daily.   Yes [provider]  atorvastatin (LIPITOR) 80 MG tablet Take 1 tablet (80 mg total) by mouth daily at 6 PM. 11/30/16  Yes Laverda Pageoberts, Lindsay B, NP  calcium carbonate (TUMS - DOSED IN MG ELEMENTAL CALCIUM) 500 MG chewable tablet Chew 1 tablet by mouth daily.   Yes [provider]  metoprolol tartrate (LOPRESSOR) 25 MG tablet Take 1 tablet (25 mg total) by mouth 2 (two) times daily. 11/30/16  Yes Su Hiltoberts,  Ernest Haber, NP  Multiple Vitamin (MULTIVITAMIN WITH MINERALS) TABS tablet Take 1 tablet by mouth daily. ODT   Yes [provider]  nitroGLYCERIN (NITROSTAT) 0.4 MG SL tablet Place 1 tablet (0.4 mg total) under the tongue every 5 (five) minutes x 3 doses as needed for chest pain. 11/30/16  Yes Arty Baumgartner, NP  olmesartan-hydrochlorothiazide (BENICAR HCT) 40-12.5 MG tablet Take 1 tablet by mouth daily. Patient not taking: Reported on 08/26/2018 01/24/18   Tonny Bollman, MD  oxyCODONE (ROXICODONE) 5 MG/5ML solution Take 5-10 mLs (5-10 mg total) by mouth every  4 (four) hours as needed for moderate pain or severe pain. Patient not taking: Reported on 08/26/2018 04/27/18   Glenna Fellows, MD  pantoprazole (PROTONIX) 40 MG tablet Take 1 tablet (40 mg total) by mouth daily. Patient not taking: Reported on 08/26/2018 04/27/18 04/27/19  Glenna Fellows, MD  protein supplement shake (PREMIER PROTEIN) LIQD Take 59.1 mLs (2 oz total) by mouth every 2 (two) hours. Patient not taking: Reported on 08/26/2018 04/27/18   Glenna Fellows, MD     Allergies:    Allergies  Allergen Reactions  . Shellfish Allergy Itching and Swelling  . Blood-Group Specific Substance     Social History:   Social History   Socioeconomic History  . Marital status: Married    Spouse name: Not on file  . Number of children: Not on file  . Years of education: Not on file  . Highest education level: Not on file  Occupational History  . Occupation: Chief Executive Officer: [employer]  Social Needs  . Financial resource strain: Not on file  . Food insecurity:    Worry: Not on file    Inability: Not on file  . Transportation needs:    Medical: Not on file    Non-medical: Not on file  Tobacco Use  . Smoking status: Never Smoker  . Smokeless tobacco: Never Used  Substance and Sexual Activity  . Alcohol use: No  . Drug use: No  . Sexual activity: Not on file  Lifestyle  . Physical activity:    Days per week: Not on file    Minutes per session: Not on file  . Stress: Not on file  Relationships  . Social connections:    Talks on phone: Not on file    Gets together: Not on file    Attends religious service: Not on file    Active member of club or organization: Not on file    Attends meetings of clubs or organizations: Not on file    Relationship status: Not on file  . Intimate partner violence:    Fear of current or ex partner: Not on file    Emotionally abused: Not on file    Physically abused: Not on file    Forced sexual activity: Not on file  Other  Topics Concern  . Not on file  Social History Narrative   Nonsmoker   Lives with husband in [place].     Family History:   The patient's family history includes Heart attack (age of onset: 56) in her father; Heart murmur in her mother; Pancreatic cancer in her mother.   The patient She indicated that her mother is deceased. She indicated that her father is deceased. She indicated that her maternal grandmother is deceased. She indicated that her maternal grandfather is deceased. She indicated that her paternal grandmother is deceased. She indicated that her paternal grandfather is deceased.  ROS:  Please see  the history of present illness.  All other ROS reviewed and negative.     Physical Exam/Data:   Vitals:   08/26/18 0945 08/26/18 1000 08/26/18 1015 08/26/18 1031  BP: 125/72 118/73 117/76 122/67  Pulse: (!) 51 (!) 55 (!) 52 (!) 52  Resp: 10 20 17 14   Temp:      TempSrc:      SpO2: 96% 96% 95% 97%   No intake or output data in the 24 hours ending 08/26/18 1112 There were no vitals filed for this visit. There is no height or weight on file to calculate BMI.  General:  Well nourished, well developed, in no acute distress HEENT: normal Lymph: no adenopathy Neck:  JVD not elevated Endocrine:  No thryomegaly Vascular: No carotid bruits; FA pulses 2+ bilaterally  Cardiac:  normal S1, S2; RRR; no murmur, no rub or gallop  Lungs:  clear to auscultation bilaterally, no wheezing, rhonchi or rales  Abd: soft, nontender, no hepatomegaly  Ext: no edema Musculoskeletal:  No deformities, BUE and BLE strength normal and equal Skin: warm and dry  Neuro:  CNs 2-12 intact, no focal abnormalities noted Psych:  Normal affect    EKG:  The ECG that was done 02/14 was personally reviewed and demonstrates sinus brady, HR 56, no acute ischemic changes, Q waves seen 2019 are improved  Relevant CV Studies:  ECHO: none  CATH: 11/28/2016 1. Acute MI involving the left circumflex, treated  successfully with primary PCI using a 3.0 x 16 mm Promus DES 2. 50% Moderate nonobstructive stenosis of the mid LAD 3. Widely patent dominant RCA with minimal irregularities 4. Mild segmental contraction abnormality the left ventricle with preserved overall LV systolic function Intervention     Laboratory Data:  Chemistry Recent Labs  Lab 08/26/18 0720  NA 138  K 3.7  CL 105  CO2 21*  GLUCOSE 156*  BUN 17  CREATININE 1.14*  CALCIUM 9.4  GFRNONAA 52*  GFRAA >60  ANIONGAP 12    No results for input(s): PROT, ALBUMIN, AST, ALT, ALKPHOS, BILITOT in the last 168 hours. Hematology Recent Labs  Lab 08/26/18 0720  WBC 7.2  RBC 4.39  HGB 12.3  HCT 38.7  MCV 88.2  MCH 28.0  MCHC 31.8  RDW 13.3  PLT 233   Cardiac Enzymes Recent Labs  Lab 08/26/18 0720  TROPONINI <0.03   Lipid Panel     Component Value Date/Time   CHOL 130 12/15/2017 1353   TRIG 128 12/15/2017 1353   HDL 45 12/15/2017 1353   CHOLHDL 2.9 12/15/2017 1353   CHOLHDL 3.9 11/29/2016 0528   VLDL 23 11/29/2016 0528   LDLCALC 59 12/15/2017 1353    Radiology/Studies:  Dg Chest 2 View  Result Date: 08/26/2018 CLINICAL DATA:  Chest pain and back pain EXAM: CHEST - 2 VIEW COMPARISON:  12/02/2017 FINDINGS: Normal heart size and mediastinal contours. There is no edema, consolidation, effusion, or pneumothorax. Spondylosis. IMPRESSION: No evidence of acute disease. Electronically Signed   By: Marnee SpringJonathon  Watts M.D.   On: 08/26/2018 08:17    Assessment and Plan:   Principal Problem: 1.  Unstable angina (HCC) -Admit, continue to cycle enzymes -Add heparin - She is reluctant to have a stress test, because her father had a heart attack after having 1 that was read is okay. - Therefore, the best option may be cardiac catheterization, she would prefer definitive evaluation. -Symptoms are concerning because they started at rest so definitive evaluation may be best. - The  risks and benefits of a cardiac  catheterization including, but not limited to, death, stroke, MI, kidney damage and bleeding were discussed with the patient who indicates understanding and agrees to proceed.   Active Problems: 2.  Hyperlipidemia associated with type 2 diabetes mellitus (HCC), goal LDL < 70 -Lipids were checked in June and were at goal, continue to follow per PCP  3.  HTN (hypertension) -Blood pressures well controlled -She has bradycardia with a heart rate occasionally in the high 40s on a beta-blocker. -She is asymptomatic so no dose change.  4.  Diabetes mellitus type 2, noninsulin dependent (HCC) -She was previously on Jardiance and metformin, both have been discontinued after weight loss secondary to bariatric surgery. -Monitor blood sugars and check a hemoglobin A1c   For questions or updates, please contact [facility] Please consult [email or web address] for contact info under Cardiology/STEMI.    SignedTheodore Demark, PA-C  08/26/2018 11:12 AM

## 2018-08-26 NOTE — Interval H&P Note (Signed)
History and Physical Interval Note:  08/26/2018 3:16 PM  Felicia Silva  has presented today for surgery, with the diagnosis of unstable angina.  The various methods of treatment have been discussed with the patient and family. After consideration of risks, benefits and other options for treatment, the patient has consented to  Procedure(s): LEFT HEART CATH AND CORONARY ANGIOGRAPHY (N/A) as a surgical intervention .  The patient's history has been reviewed, patient examined, no change in status, stable for surgery.  I have reviewed the patient's chart and labs.  Questions were answered to the patient's satisfaction.    Cath Lab Visit (complete for each Cath Lab visit)  Clinical Evaluation Leading to the Procedure:   ACS: No.  Non-ACS:    Anginal Classification: CCS III  Anti-ischemic medical therapy: Maximal Therapy (2 or more classes of medications)  Non-Invasive Test Results: No non-invasive testing performed  Prior CABG: No previous CABG         Verne Carrow

## 2018-08-26 NOTE — ED Provider Notes (Signed)
[facility] EMERGENCY DEPARTMENT Provider Note   CSN: [id number] Arrival date & time: 08/26/18  0707     History   Chief Complaint No chief complaint on file.   HPI Felicia Silva is a 63 y.o. female.  HPI  The patient is a 63 year old female, she has a known history of diabetes, hypertension, she had an ST elevation myocardial infarction with an occlusion of her left circumflex artery and underwent a coronary stent procedure in May 2018.  She presents to the hospital today after developing chest pain that occurred at approximately 4:00 in the morning while she was doing "housework".  She reports that she was carrying some close and doing other minor activities when she developed acute onset of a heaviness across her chest underneath her breasts which radiated to between her shoulder blades.  There was no radiation to the arms or the shoulders, there was no diaphoresis though she did feel nauseated.  She has not been having any coughing or shortness of breath and denies any swelling of her legs.  On further history she does report that over the last couple of weeks she has had some minor episodes of some chest discomfort which occurred at work, were short-lived and then resolved.  The pain that she is having today has been stuttering over the last 3 and half hours, it improves with nitroglycerin but then came back after 5 to 10 minutes.  At this time she is having a slight pain between her shoulder blades but denies any further pain in her chest.  She has been taking her medications as prescribed including a baby aspirin daily which she has already taken today.  Past Medical History:  Diagnosis Date  . CAD (coronary artery disease), native coronary artery    11/28/16 STEMI PCI DES--LCx, EF normal.   . Complication of anesthesia   . Diabetes mellitus without complication (HCC)   . Hyperlipidemia associated with type 2 diabetes mellitus (HCC), goal LDL < 70 11/30/2016  .  Hypertension   . Multiple thyroid nodules    on CT of chest  . PONV (postoperative nausea and vomiting)   . STEMI (ST elevation myocardial infarction) (HCC) 11/2016   DES CFX    Patient Active Problem List   Diagnosis Date Noted  . CAD (coronary artery disease) 08/27/2018  . Bradycardia 08/27/2018  . CKD (chronic kidney disease), stage II 08/27/2018  . Unstable angina (HCC) 08/26/2018  . HTN (hypertension) 08/26/2018  . Diabetes mellitus type 2, noninsulin dependent (HCC) 08/26/2018  . Precordial pain   . Morbid obesity with BMI of 45.0-49.9, adult (HCC) 04/25/2018  . Hyperlipidemia associated with type 2 diabetes mellitus (HCC), goal LDL < 70 11/30/2016  . Acute ST elevation myocardial infarction (STEMI) involving left circumflex coronary artery (HCC) 11/28/2016  . STEMI involving left circumflex coronary artery (HCC) 11/28/2016  . ST elevation myocardial infarction involving left circumflex coronary artery (HCC) 11/28/2016    Past Surgical History:  Procedure Laterality Date  . ABDOMINAL SURGERY    . CHOLECYSTECTOMY    . CORONARY STENT INTERVENTION N/A 11/28/2016   Procedure: Coronary Stent Intervention;  Surgeon: Tonny Bollman, MD;  Location: [facility] INVASIVE CV LAB;  Service: Cardiovascular;  Laterality: N/A;  . 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   Procedure: LAPAROSCOPIC ROUX-EN-Y GASTRIC BYPASS WITH UPPER ENDOSCOPY AND ERAS PATHWAY;  Surgeon: Glenna Fellows, MD;  Location: [facility] ORS;  Service: General;  Laterality: N/A;  . HERNIA REPAIR    . LEFT HEART CATH  AND CORONARY ANGIOGRAPHY N/A 11/28/2016   Procedure: Left Heart Cath and Coronary Angiography;  Surgeon: Tonny Bollmanooper, Michael, MD;  [facility];  Service: Cardiovascular;  Laterality: N/A;  . TONSILLECTOMY       OB History   No obstetric history on file.      Home Medications    Prior to Admission medications   Medication Sig Start Date End Date Taking? Authorizing Provider  acetaminophen-codeine  (TYLENOL #3) 300-30 MG tablet Take 1 tablet by mouth as needed. 08/22/18  Yes [provider]  amLODipine (NORVASC) 5 MG tablet Take 5 mg by mouth at bedtime. 11/27/16  Yes [provider]  aspirin 81 MG chewable tablet Chew 81 mg by mouth daily.   Yes [provider]  atorvastatin (LIPITOR) 80 MG tablet Take 1 tablet (80 mg total) by mouth daily at 6 PM. 11/30/16  Yes Laverda Pageoberts, Lindsay B, NP  calcium carbonate (TUMS - DOSED IN MG ELEMENTAL CALCIUM) 500 MG chewable tablet Chew 1 tablet by mouth daily.   Yes [provider]  metoprolol tartrate (LOPRESSOR) 25 MG tablet Take 1 tablet (25 mg total) by mouth 2 (two) times daily. 11/30/16  Yes Arty Baumgartneroberts, Lindsay B, NP  Multiple Vitamin (MULTIVITAMIN WITH MINERALS) TABS tablet Take 1 tablet by mouth daily. ODT   Yes [provider]  nitroGLYCERIN (NITROSTAT) 0.4 MG SL tablet Place 1 tablet (0.4 mg total) under the tongue every 5 (five) minutes x 3 doses as needed for chest pain. 11/30/16  Yes Laverda Pageoberts, Lindsay B, NP  isosorbide mononitrate (IMDUR) 30 MG 24 hr tablet Take 1 tablet (30 mg total) by mouth daily. 08/27/18   Dunn, Raymon Muttonyan M, PA-C  protein supplement shake (PREMIER PROTEIN) LIQD Take 59.1 mLs (2 oz total) by mouth every 2 (two) hours. Patient not taking: Reported on 08/26/2018 04/27/18   Glenna FellowsHoxworth, Benjamin, MD    Family History Family History  Problem Relation Age of Onset  . Heart attack Father 646  . Pancreatic cancer Mother   . Heart murmur Mother     Social History Social History   Tobacco Use  . Smoking status: Never Smoker  . Smokeless tobacco: Never Used  Substance Use Topics  . Alcohol use: No  . Drug use: No     Allergies   Shellfish allergy and Blood-group specific substance   Review of Systems Review of Systems  All other systems reviewed and are negative.    Physical Exam Updated Vital Signs BP 137/85   Pulse (!) 54   Temp 98.2 F (36.8 C) (Oral)   Resp 11   SpO2 100%    Physical Exam Vitals signs and nursing note reviewed.  Constitutional:      General: She is not in acute distress.    Appearance: She is well-developed.  HENT:     Head: Normocephalic and atraumatic.     Mouth/Throat:     Pharynx: No oropharyngeal exudate.  Eyes:     General: No scleral icterus.       Right eye: No discharge.        Left eye: No discharge.     Conjunctiva/sclera: Conjunctivae normal.     Pupils: Pupils are equal, round, and reactive to light.  Neck:     Musculoskeletal: Normal range of motion and neck supple.     Thyroid: No thyromegaly.     Vascular: No JVD.  Cardiovascular:     Rate and Rhythm: Regular rhythm. Bradycardia present.  Heart sounds: Normal heart sounds. No murmur. No friction rub. No gallop.   Pulmonary:     Effort: Pulmonary effort is normal. No respiratory distress.     Breath sounds: Normal breath sounds. No wheezing or rales.  Abdominal:     General: Bowel sounds are normal. There is no distension.     Palpations: Abdomen is soft. There is no mass.     Tenderness: There is no abdominal tenderness.  Musculoskeletal: Normal range of motion.        General: No tenderness.  Lymphadenopathy:     Cervical: No cervical adenopathy.  Skin:    General: Skin is warm and dry.     Findings: No erythema or rash.  Neurological:     Mental Status: She is alert.     Coordination: Coordination normal.  Psychiatric:        Behavior: Behavior normal.      ED Treatments / Results  Labs (all labs ordered are listed, but only abnormal results are displayed) Labs Reviewed  BASIC METABOLIC PANEL - Abnormal; Notable for the following components:      Result Value   CO2 21 (*)    Glucose, Bld 156 (*)    Creatinine, Ser 1.14 (*)    GFR calc non Af Amer 52 (*)    All other components within normal limits  HEPATIC FUNCTION PANEL - Abnormal; Notable for the following components:   Total Protein 6.1 (*)    Albumin 3.4 (*)    AST 318 (*)    ALT  171 (*)    Alkaline Phosphatase 171 (*)    Total Bilirubin 1.3 (*)    Bilirubin, Direct 0.4 (*)    All other components within normal limits  HEMOGLOBIN A1C - Abnormal; Notable for the following components:   Hgb A1c MFr Bld 8.1 (*)    All other components within normal limits  LIPID PANEL - Abnormal; Notable for the following components:   HDL 38 (*)    All other components within normal limits  BASIC METABOLIC PANEL - Abnormal; Notable for the following components:   CO2 21 (*)    Glucose, Bld 133 (*)    Creatinine, Ser 1.19 (*)    GFR calc non Af Amer 49 (*)    GFR calc Af Amer 57 (*)    All other components within normal limits  CBC - Abnormal; Notable for the following components:   Hemoglobin 11.6 (*)    All other components within normal limits  GLUCOSE, CAPILLARY - Abnormal; Notable for the following components:   Glucose-Capillary 175 (*)    All other components within normal limits  GLUCOSE, CAPILLARY - Abnormal; Notable for the following components:   Glucose-Capillary 122 (*)    All other components within normal limits  CBG MONITORING, ED - Abnormal; Notable for the following components:   Glucose-Capillary 134 (*)    All other components within normal limits  CBC  PROTIME-INR  TROPONIN I  HIV ANTIBODY (ROUTINE TESTING W REFLEX)  TROPONIN I    EKG EKG Interpretation  Date/Time:  Friday August 26 2018 07:16:56 EST Ventricular Rate:  58 PR Interval:  208 QRS Duration: 88 QT Interval:  442 QTC Calculation: 433 R Axis:   26 Text Interpretation:  Sinus bradycardia Anterior infarct , age undetermined Nonspecific ST abnormality Abnormal ECG since last tracing no significant change other than rate slower Confirmed by Eber Hong ([id number]) on 08/26/2018 7:42:56 AM   EKG Interpretation  Date/Time:  Friday August 26 2018 07:51:22 EST Ventricular Rate:  56 PR Interval:  208 QRS Duration: 100 QT Interval:  476 QTC Calculation: 460 R Axis:   58 Text  Interpretation:  Sinus bradycardia Nonspecific ST abnormality since last tracing no significant change Confirmed by Eber Hong ([id number]) on 08/26/2018 8:25:56 AM         Radiology Dg Chest 2 View  Result Date: 08/26/2018 CLINICAL DATA:  Chest pain and back pain EXAM: CHEST - 2 VIEW COMPARISON:  12/02/2017 FINDINGS: Normal heart size and mediastinal contours. There is no edema, consolidation, effusion, or pneumothorax. Spondylosis. IMPRESSION: No evidence of acute disease. Electronically Signed   By: Marnee Spring M.D.   On: 08/26/2018 08:17    Procedures Procedures (including critical care time)  Medications Ordered in ED Medications  0.9 %  sodium chloride infusion ( Intravenous Stopped 08/26/18 2216)  aspirin chewable tablet 324 mg (243 mg Oral Given 08/26/18 0753)  nitroGLYCERIN (NITROGLYN) 2 % ointment 1 inch (1 inch Topical Given 08/26/18 0754)  heparin bolus via infusion 4,000 Units (4,000 Units Intravenous Bolus from Bag 08/26/18 1327)     Initial Impression / Assessment and Plan / ED Course  I have reviewed the triage vital signs and the nursing notes.  Pertinent labs & imaging results that were available during my care of the patient were reviewed by me and considered in my medical decision making (see chart for details).  Clinical Course as of Aug 27 1737  Fri Aug 26, 2018  0846 Troponin negative, will consult with cardiology   [BM]    Clinical Course User Index [BM] Eber Hong, MD    The pt appears well but has EKG without STEMI - sx are primarily in between the scapula.  She has labs showing normal CBC and glucose and normal xray, trop pending, ASA given and nitro paste applied - will d/w cardiology.  Vitals:   08/26/18 0711 08/26/18 0730  BP: (!) 166/94 137/85  Pulse: 62 (!) 54  Resp: 16 11  Temp: 98.2 F (36.8 C)   TempSrc: Oral   SpO2: 99% 100%   Care was discussed with Dr. Duke Salvia with the cardiology service, she will arrange for the cardiology  service to see the patient.  I am concerned that the patient has unstable angina and will need to be admitted to the hospital.  Thankfully initial troponin is negative but repeat EKG does not show changes or ischemia  The patient will be admitted to the hospital to the cardiology service.  Final Clinical Impressions(s) / ED Diagnoses   Final diagnoses:  Unstable angina (HCC)      Eber Hong, MD 08/27/18 1739

## 2018-08-27 DIAGNOSIS — R001 Bradycardia, unspecified: Secondary | ICD-10-CM

## 2018-08-27 DIAGNOSIS — E119 Type 2 diabetes mellitus without complications: Secondary | ICD-10-CM | POA: Diagnosis not present

## 2018-08-27 DIAGNOSIS — I200 Unstable angina: Secondary | ICD-10-CM | POA: Diagnosis not present

## 2018-08-27 DIAGNOSIS — I25110 Atherosclerotic heart disease of native coronary artery with unstable angina pectoris: Secondary | ICD-10-CM

## 2018-08-27 DIAGNOSIS — I129 Hypertensive chronic kidney disease with stage 1 through stage 4 chronic kidney disease, or unspecified chronic kidney disease: Secondary | ICD-10-CM | POA: Diagnosis not present

## 2018-08-27 DIAGNOSIS — E1122 Type 2 diabetes mellitus with diabetic chronic kidney disease: Secondary | ICD-10-CM | POA: Diagnosis not present

## 2018-08-27 DIAGNOSIS — E785 Hyperlipidemia, unspecified: Secondary | ICD-10-CM | POA: Diagnosis not present

## 2018-08-27 DIAGNOSIS — I2510 Atherosclerotic heart disease of native coronary artery without angina pectoris: Secondary | ICD-10-CM

## 2018-08-27 DIAGNOSIS — Z955 Presence of coronary angioplasty implant and graft: Secondary | ICD-10-CM | POA: Diagnosis not present

## 2018-08-27 DIAGNOSIS — I252 Old myocardial infarction: Secondary | ICD-10-CM | POA: Diagnosis not present

## 2018-08-27 DIAGNOSIS — N182 Chronic kidney disease, stage 2 (mild): Secondary | ICD-10-CM

## 2018-08-27 LAB — BASIC METABOLIC PANEL
ANION GAP: 15 (ref 5–15)
BUN: 17 mg/dL (ref 8–23)
CALCIUM: 9.2 mg/dL (ref 8.9–10.3)
CO2: 21 mmol/L — ABNORMAL LOW (ref 22–32)
Chloride: 107 mmol/L (ref 98–111)
Creatinine, Ser: 1.19 mg/dL — ABNORMAL HIGH (ref 0.44–1.00)
GFR calc non Af Amer: 49 mL/min — ABNORMAL LOW (ref >60)
GFR, EST AFRICAN AMERICAN: 57 mL/min — AB (ref >60)
Glucose, Bld: 133 mg/dL — ABNORMAL HIGH (ref 70–99)
Potassium: 4.1 mmol/L (ref 3.5–5.1)
Sodium: 143 mmol/L (ref 135–145)

## 2018-08-27 LAB — CBC
HCT: 36.1 % (ref 36.0–46.0)
Hemoglobin: 11.6 g/dL — ABNORMAL LOW (ref 12.0–15.0)
MCH: 28 pg (ref 26.0–34.0)
MCHC: 32.1 g/dL (ref 30.0–36.0)
MCV: 87.2 fL (ref 80.0–100.0)
PLATELETS: 235 10*3/uL (ref 150–400)
RBC: 4.14 MIL/uL (ref 3.87–5.11)
RDW: 13.4 % (ref 11.5–15.5)
WBC: 6.2 10*3/uL (ref 4.0–10.5)
nRBC: 0 % (ref 0.0–0.2)

## 2018-08-27 LAB — GLUCOSE, CAPILLARY: Glucose-Capillary: 122 mg/dL — ABNORMAL HIGH (ref 70–99)

## 2018-08-27 LAB — HIV ANTIBODY (ROUTINE TESTING W REFLEX): HIV Screen 4th Generation wRfx: NONREACTIVE

## 2018-08-27 MED ORDER — ISOSORBIDE MONONITRATE ER 30 MG PO TB24: 30.0000 mg | ORAL_TABLET | Freq: Every day | ORAL | 5 refills | Status: DC

## 2018-08-27 MED ORDER — ISOSORBIDE MONONITRATE ER 30 MG PO TB24: 30.0000 mg | ORAL_TABLET | Freq: Every day | ORAL | Status: DC

## 2018-08-27 NOTE — Progress Notes (Signed)
Progress Note  Patient Name: Felicia Silva Date of Encounter: 08/27/2018  Primary Cardiologist: Excell Seltzer  Subjective   She underwent LHC on 08/26/2018 in the setting of unstable angina that showed a patent stent in the mid to distal LCx without restenosis. There was moderate mid LAD stenosis that was unchanged from her prior cath in 12/2016 and did not appear to be flow limiting. There was mild non-obstructive disease in the mid and distal RCA. Medical management was advised.   Post cath labs showed stable mildly elevated SCr of 1.19, K+ 4.1, and a stable mildly low HGB of 11.6.  BP remains mildly elevated in the 130s to 150s systolic.   Chest pain free. No SOB, palpitations, dizziness, presyncope or syncope.   Inpatient Medications    Scheduled Meds: . aspirin  81 mg Oral Daily  . atorvastatin  80 mg Oral q1800  . calcium carbonate  1 tablet Oral Daily  . metoprolol tartrate  25 mg Oral BID  . multivitamin with minerals  1 tablet Oral Daily  . sodium chloride flush  3 mL Intravenous Q12H  . sodium chloride flush  3 mL Intravenous Q12H   Continuous Infusions: . sodium chloride    . sodium chloride 100 mL/hr at 08/26/18 1142  . sodium chloride     PRN Meds: sodium chloride, sodium chloride, acetaminophen, ALPRAZolam, nitroGLYCERIN, ondansetron (ZOFRAN) IV, sodium chloride flush, sodium chloride flush, zolpidem   Vital Signs    Vitals:   08/26/18 2128 08/27/18 0321 08/27/18 0643 08/27/18 0720  BP: 140/74 (!) 152/88  (!) 156/79  Pulse: (!) 52 (!) 51  (!) 53  Resp:  16  14  Temp:  97.9 F (36.6 C)  (!) 97.5 F (36.4 C)  TempSrc:  Oral  Oral  SpO2:  97%  97%  Weight:   98.3 kg     Intake/Output Summary (Last 24 hours) at 08/27/2018 0755 Last data filed at 08/27/2018 0640 Gross per 24 hour  Intake 1480 ml  Output 1600 ml  Net -120 ml   Filed Weights   08/26/18 1200 08/26/18 1257 08/27/18 0643  Weight: 100.7 kg 100.7 kg 98.3 kg    Telemetry    Sinus  bradycardia to sinus rhythm with heart rates in the mid 40s to mid 60s bpm - Personally Reviewed  ECG    Sinus bradycardia, 48 bpm, 1st degree AV block, no acute st/t changes - Personally Reviewed  Physical Exam   GEN: No acute distress. HOH. Neck: No JVD. Cardiac: RRR, no murmurs, rubs, or gallops. Right radial cath site is well healing without active bleeding, bruising, swelling, warmth, erythema, or TTP. Radial pulse 2+.  Respiratory: Clear to auscultation bilaterally.  GI: Soft, nontender, non-distended.   MS: No edema; No deformity. Neuro:  Alert and oriented x 3; Nonfocal.  Psych: Normal affect.  Labs    Chemistry Recent Labs  Lab 08/26/18 0720 08/26/18 1140 08/27/18 0234  NA 138  --  143  K 3.7  --  4.1  CL 105  --  107  CO2 21*  --  21*  GLUCOSE 156*  --  133*  BUN 17  --  17  CREATININE 1.14*  --  1.19*  CALCIUM 9.4  --  9.2  PROT  --  6.1*  --   ALBUMIN  --  3.4*  --   AST  --  318*  --   ALT  --  171*  --   ALKPHOS  --  171*  --   BILITOT  --  1.3*  --   GFRNONAA 52*  --  49*  GFRAA >60  --  57*  ANIONGAP 12  --  15     Hematology Recent Labs  Lab 08/26/18 0720 08/27/18 0234  WBC 7.2 6.2  RBC 4.39 4.14  HGB 12.3 11.6*  HCT 38.7 36.1  MCV 88.2 87.2  MCH 28.0 28.0  MCHC 31.8 32.1  RDW 13.3 13.4  PLT 233 235    Cardiac Enzymes Recent Labs  Lab 08/26/18 0720 08/26/18 1140  TROPONINI <0.03 <0.03   No results for input(s): TROPIPOC in the last 168 hours.   BNPNo results for input(s): BNP, PROBNP in the last 168 hours.   DDimer No results for input(s): DDIMER in the last 168 hours.   Radiology    Dg Chest 2 View  Result Date: 08/26/2018 CLINICAL DATA:  Chest pain and back pain EXAM: CHEST - 2 VIEW COMPARISON:  12/02/2017 FINDINGS: Normal heart size and mediastinal contours. There is no edema, consolidation, effusion, or pneumothorax. Spondylosis. IMPRESSION: No evidence of acute disease. Electronically Signed   By: Marnee Spring M.D.    On: 08/26/2018 08:17    Cardiac Studies   LHC 08/26/2018: Conclusion     Previously placed Mid Cx drug eluting stent is widely patent.  Mid RCA lesion is 20% stenosed.  Dist RCA lesion is 30% stenosed.  Prox LAD lesion is 50% stenosed.  The left ventricular systolic function is normal.  LV end diastolic pressure is normal.  The left ventricular ejection fraction is 55-65% by visual estimate.  There is no mitral valve regurgitation.   1. Patent stent mid to distal Circumflex artery without restenosis 2. Moderate mid LAD stenosis. This is unchanged from her last cath and does not appear to be flow limiting.  3. Mild non-obstructive disease in the mid and distal RCA 4. Preserved LV systolic function  Recommendations: Continue medical management of CAD     Patient Profile     63 y.o. female with history of CAD with STEMI in 12/2016 s/p PCI/DES to the LCx with residual stenosis of the LAD medically managed, DM, HTN, HLD, thyroid nodules, and obesity s/p laparoscopic gastric bypass in 04/2018 who was admitted to the hospital on 08/26/2018 with unstable angina.   Assessment & Plan    1. CAD involving the native coronary arteries with unstable angina: -Currently, chest pain free -LHC on 08/26/2018 with medical management as above -Cannot exclude vasospasm -Add Imdur 30 mg daily -Continue ASA, Lipitor, and Lopressor -She reports two scheduled shifts the upcoming week and this then off the following week for vacation. We will provide her with an out of work note for the scheduled two shifts the upcoming week -Follow up in our office within 7-14 days (message has been sent)  2. CKD stage II: -Post cath renal function stable  3. HTN: -Blood pressure mildly elevated in the 130s to 150s systolic -Add Imdur as above -Continue Lopressor 25 mg bid (bradycardic heart rates precludes further titration at this time)  4. HLD: -LDL of 49 this admission -Continue Lipitor 80 mg  daily  5. DM: -A1c 8.1 on 08/26/2018 -Not on metformin as an outpatient  -Follow up with PCP  6. Morbid obesity: -Status post gastric bypass in 04/2018 -Given bradycardic heart rates, particularly overnight, recommend outpatient sleep study evaluation   For questions or updates, please contact [facility] Please consult [email or web address] for contact info under Cardiology/STEMI.  Signed, Eula Listenyan , PA-C [facility] Pager: [phone] 08/27/2018, 7:55 AM

## 2018-08-27 NOTE — Discharge Summary (Signed)
Discharge Summary    Patient ID: Felicia Silva  MRN: [id number], DOB/AGE: Nov 20, 1955 63 y.o.  Admit Date: 08/26/2018 Discharge Date: 08/27/2018  Primary Care Provider: Benita Stabile, MD Primary Cardiologist: Dr. Excell Seltzer, MD  Discharge Diagnoses    Principal Problem:   Unstable angina [facility]) Active Problems:   CAD (coronary artery disease)   Hyperlipidemia associated with type 2 diabetes mellitus (HCC), goal LDL < 70   HTN (hypertension)   Diabetes mellitus type 2, noninsulin dependent (HCC)   CKD (chronic kidney disease), stage II   Morbid obesity with BMI of 45.0-49.9, adult (HCC)   Bradycardia   Allergies Allergies  Allergen Reactions  . Shellfish Allergy Itching and Swelling  . Blood-Group Specific Substance      History of Present Illness     63 year old female with history of CAD with STEMI in 11/2016 s/p PCI/DES to the LCx with residual nonobstructive LAD disease, diabetes, HTN, HLD, morbid obesity s/p laparoscopic gastric bypass in 04/2018, and thyroid nodules who was admitted to the hospital on 08/26/2018 with unstable angina.  Prior LHC in 12/2016 in the setting of a STEMI showed mid LAD 50% stenosis, mid LCx 100% stenosis s/p PCI/DES, RCA with minimal luminal irregularities. LVEF 55-60%. She was last seen in the office in 11/2017 and doing well. She underwent laparoscopic gastric bypass in 04/2018.  She was admitted to the hospital in 08/26/2018 with onset of substernal chest pain rated 5/10 without associated symptoms and was resolved with SL NTG. Initially, she did not call EMS as her symptoms resolved, however she had another episode of chest pain which too was relieved with SL NTG. Following this, she continued to have several episodes of chest pain (5-10 in total) prompting her to come to the ED. Symptoms were similar to her MI.   Hospital Course     Consultants: none   Upon her arrival in the ED, she was given ASA 324 mg and NTG paste with resolution of  symptoms. Troponin was negative x 2. Given her similarity of symptoms when compared to her prior MI and her upcoming vacation, she underwent LHC on 08/26/2018 that showed a patent stent in the mid to distal LCx without restenosis, moderate mid LAD stenosis that was unchanged from her prior cath in 2018 and did not appear flow limiting. There was mild nonobstructive disease in the mid and distal RCA. LVEF estimated at 55-65%. Continued medical management was advised.   Post cath labs showed a mildly elevated, though stable SCr of 1.19 and a low though stable HGB of 11.6. She did not have any further chest pain during her admission. Imdur 30 mg daily was added given potential vasospasm and elevated BP readings in the 130s to 150s systolic. Bradycardia precluded titration of her Lopressor. It was recommended she undergo outpatient sleep study given her body habitus and noted bradycardia overnight.   The patient's right radial cath site has been examined is healing well without issues at this time. The patient has been seen by Dr. Cristal Deer and felt to be stable for discharge today. All follow up appointments have been made. Discharge medications are listed below. Prescriptions have been reviewed with the patient and sent in to their pharmacy.  _____________  Discharge Vitals Blood pressure (!) 156/79, pulse (!) 53, temperature (!) 97.5 F (36.4 C), temperature source Oral, resp. rate 14, weight 98.3 kg, SpO2 97 %.  Filed Weights   08/26/18 1200 08/26/18 1257 08/27/18 0643  Weight: 100.7 kg  100.7 kg 98.3 kg    Labs & Radiologic Studies    CBC Recent Labs    08/26/18 0720 08/27/18 0234  WBC 7.2 6.2  HGB 12.3 11.6*  HCT 38.7 36.1  MCV 88.2 87.2  PLT 233 235   Basic Metabolic Panel Recent Labs    38/10/17 0720 08/27/18 0234  NA 138 143  K 3.7 4.1  CL 105 107  CO2 21* 21*  GLUCOSE 156* 133*  BUN 17 17  CREATININE 1.14* 1.19*  CALCIUM 9.4 9.2   Liver Function Tests Recent Labs     08/26/18 1140  AST 318*  ALT 171*  ALKPHOS 171*  BILITOT 1.3*  PROT 6.1*  ALBUMIN 3.4*   No results for input(s): LIPASE, AMYLASE in the last 72 hours. Cardiac Enzymes Recent Labs    08/26/18 0720 08/26/18 1140  TROPONINI <0.03 <0.03   BNP Invalid input(s): POCBNP D-Dimer No results for input(s): DDIMER in the last 72 hours. Hemoglobin A1C Recent Labs    08/26/18 1141  HGBA1C 8.1*   Fasting Lipid Panel Recent Labs    08/26/18 1141  CHOL 96  HDL 38*  LDLCALC 49  TRIG 44  CHOLHDL 2.5   Thyroid Function Tests No results for input(s): TSH, T4TOTAL, T3FREE, THYROIDAB in the last 72 hours.  Invalid input(s): FREET3 _____________  Dg Chest 2 View  Result Date: 08/26/2018 CLINICAL DATA:  Chest pain and back pain EXAM: CHEST - 2 VIEW COMPARISON:  12/02/2017 FINDINGS: Normal heart size and mediastinal contours. There is no edema, consolidation, effusion, or pneumothorax. Spondylosis. IMPRESSION: No evidence of acute disease. Electronically Signed   By: Marnee Spring M.D.   On: 08/26/2018 08:17    Diagnostic Studies/Procedures   LHC 08/26/2018: Coronary Findings   Diagnostic  Dominance: Right  Left Anterior Descending  Vessel is large.  Prox LAD lesion 50% stenosed  Prox LAD lesion is 50% stenosed.  Left Circumflex  Vessel is large.  Mid Cx lesion 0% stenosed  Previously placed Mid Cx drug eluting stent is widely patent.  Right Coronary Artery  Vessel is large. Vessel is angiographically normal.  Mid RCA lesion 20% stenosed  Mid RCA lesion is 20% stenosed.  Dist RCA lesion 30% stenosed  Dist RCA lesion is 30% stenosed.  Intervention   No interventions have been documented.  Wall Motion   Resting               Left Heart   Left Ventricle The left ventricular size is normal. The left ventricular systolic function is normal. LV end diastolic pressure is normal. The left ventricular ejection fraction is 55-65% by visual estimate. No regional wall  motion abnormalities. There is no evidence of mitral regurgitation.  Coronary Diagrams   Diagnostic  Dominance: Right    Conclusion     Previously placed Mid Cx drug eluting stent is widely patent.  Mid RCA lesion is 20% stenosed.  Dist RCA lesion is 30% stenosed.  Prox LAD lesion is 50% stenosed.  The left ventricular systolic function is normal.  LV end diastolic pressure is normal.  The left ventricular ejection fraction is 55-65% by visual estimate.  There is no mitral valve regurgitation.   1. Patent stent mid to distal Circumflex artery without restenosis 2. Moderate mid LAD stenosis. This is unchanged from her last cath and does not appear to be flow limiting.  3. Mild non-obstructive disease in the mid and distal RCA 4. Preserved LV systolic function  Recommendations: Continue medical management  of CAD   _____________  Disposition   Pt is being discharged home today in good condition.  Follow-up Plans & Appointments    Follow-up Information    Tonny Bollmanooper, Michael, MD Follow up.   Specialty:  Cardiology Why:  A message has been sent to the office for hospital follow up within 7-14 days.  Contact information: [address] [place] [address] [phone]          Discharge Instructions    Diet - low sodium heart healthy   Complete by:  As directed    Increase activity slowly   Complete by:  As directed       Discharge Medications   Allergies as of 08/27/2018      Reactions   Shellfish Allergy Itching, Swelling   Blood-group Specific Substance       Medication List    TAKE these medications   acetaminophen-codeine 300-30 MG tablet Commonly known as:  TYLENOL #3 Take 1 tablet by mouth as needed.   amLODipine 5 MG tablet Commonly known as:  NORVASC Take 5 mg by mouth at bedtime.   aspirin 81 MG chewable tablet Chew 81 mg by mouth daily.   atorvastatin 80 MG tablet Commonly known as:  LIPITOR Take 1 tablet (80  mg total) by mouth daily at 6 PM.   calcium carbonate 500 MG chewable tablet Commonly known as:  TUMS - dosed in mg elemental calcium Chew 1 tablet by mouth daily.   isosorbide mononitrate 30 MG 24 hr tablet Commonly known as:  IMDUR Take 1 tablet (30 mg total) by mouth daily.   metoprolol tartrate 25 MG tablet Commonly known as:  LOPRESSOR Take 1 tablet (25 mg total) by mouth 2 (two) times daily.   multivitamin with minerals Tabs tablet Take 1 tablet by mouth daily. ODT   nitroGLYCERIN 0.4 MG SL tablet Commonly known as:  NITROSTAT Place 1 tablet (0.4 mg total) under the tongue every 5 (five) minutes x 3 doses as needed for chest pain.   olmesartan-hydrochlorothiazide 40-12.5 MG tablet Commonly known as:  BENICAR HCT Take 1 tablet by mouth daily.   oxyCODONE 5 MG/5ML solution Commonly known as:  ROXICODONE Take 5-10 mLs (5-10 mg total) by mouth every 4 (four) hours as needed for moderate pain or severe pain.   pantoprazole 40 MG tablet Commonly known as:  PROTONIX Take 1 tablet (40 mg total) by mouth daily.   protein supplement shake Liqd Commonly known as:  PREMIER PROTEIN Take 59.1 mLs (2 oz total) by mouth every 2 (two) hours.         Aspirin prescribed at discharge?  Yes High Intensity Statin Prescribed? (Lipitor 40-80mg  or Crestor 20-40mg ): Yes Beta Blocker Prescribed? Yes For EF <40%, was ACEI/ARB Prescribed? No: EF > 40% ADP Receptor Inhibitor Prescribed? (i.e. Plavix etc.-Includes Medically Managed Patients): No: greater than 12 months from most recent PCI For EF <40%, Aldosterone Inhibitor Prescribed? No: EF > 40% Was EF assessed during THIS hospitalization? Yes Was Cardiac Rehab II ordered? (Included Medically managed Patients): Yes   Outstanding Labs/Studies   None  Duration of Discharge Encounter   Greater than 30 minutes including physician time.  Signed, Sondra Bargesyan M, Tanya Crothers, PA-C [facility] Pager: [phone] 08/27/2018, 9:22 AM

## 2018-08-27 NOTE — Discharge Instructions (Signed)
Coronary Artery Disease, Female  Coronary artery disease (CAD) is a condition in which the arteries that lead to the heart (coronary arteries) become narrow or blocked. The narrowing or blockage can lead to decreased blood flow to the heart. Prolonged reduced blood flow can cause a heart attack (myocardial infarction or MI). This condition may also be called coronary heart disease. Because CAD is the leading cause of death in women, it is important to understand what causes this condition and how it is treated. What are the causes? CAD is most often caused by atherosclerosis. This is the buildup of fat and cholesterol (plaque) on the inside of the arteries. Over time, the plaque may narrow or block the artery, reducing blood flow to the heart. Plaque can also become weak and break off within a coronary artery and cause a sudden blockage. Other less common causes of CAD include:  An embolism or blood clot in a coronary artery.  A tearing of the artery (spontaneous coronary artery dissection).  An aneurysm.  Inflammation (vasculitis) in the artery wall. What increases the risk? The following factors may make you more likely to develop this condition:  Age. Women over age 55 are at a greater risk of CAD.  Family history of CAD.  High blood pressure (hypertension).  Diabetes.  High cholesterol levels.  Tobacco use.  Lack of exercise.  Menopause. ? All postmenopausal women are at greater risk of CAD. ? Women who have experienced menopause between the ages of 40-45 (early menopause) are at a higher risk of CAD. ? Women who have experienced menopause before age 40 (premature menopause) are at a very high risk of CAD.  Excessive alcohol use  A diet high in saturated and trans fats, such as fried food and processed meat. Other possible risk factors include:  High stress levels.  Depression  Obesity.  Sleep apnea. What are the signs or symptoms? Many people do not have any  symptoms during the early stages of CAD. As the condition progresses, symptoms may include:  Chest pain (angina). The pain can: ? Feel like crushing or squeezing, or a tightness, pressure, fullness, or heaviness in the chest. ? Last more than a few minutes or can stop and recur. The pain tends to get worse with exercise or stress and to fade with rest.  Pain in the arms, neck, jaw, or back.  Unexplained heartburn or indigestion.  Shortness of breath.  Nausea.  Sudden cold sweats.  Sudden light-headedness.  Fluttering or fast heartbeat (palpitations). Many women have chest discomfort and the other symptoms. However, women often have unusual (atypical) symptoms, such as:  Fatigue.  Vomiting.  Unexplained feelings of nervousness or anxiety.  Unexplained weakness.  Dizziness or fainting. How is this diagnosed? This condition is diagnosed based on:  Your family and medical history.  A physical exam.  Tests, including: ? A test to check the electrical signals in your heart (electrocardiogram). ? Exercise stress test. This looks for signs of blockage when the heart is stressed with exercise, such as running on a treadmill. ? Pharmacologic stress test. This test looks for signs of blockage when the heart is being stressed with a medicine. ? Blood tests. ? Coronary angiogram. This is a procedure to look at the coronary arteries to see if there is any blockage. During this test, a dye is injected into your arteries so they appear on an X-ray. ? A test that uses sound waves to take a picture of your heart (echocardiogram). ?   Chest X-ray. How is this treated? This condition may be treated by:  Healthy lifestyle changes to reduce risk factors.  Medicines such as: ? Antiplatelet medicines and blood-thinning medicines, such as aspirin. These help prevent blood clots. ? Nitroglycerin. ? Blood pressure medicines. ? Cholesterol-lowering medicine.  Coronary angioplasty and  stenting. During this procedure, a thin, flexible tube is inserted through a blood vessel and into a blocked artery. A balloon or similar device on the end of the tube is inflated to open up the artery. In some cases, a small, mesh tube (stent) is inserted into the artery to keep it open.  Coronary artery bypass surgery. During this surgery, veins or arteries from other parts of the body are used to create a bypass around the blockage and allow blood to reach your heart. Follow these instructions at home: Medicines  Take over-the-counter and prescription medicines only as told by your health care provider.  Do not take the following medicines unless your health care provider approves: ? NSAIDs, such as ibuprofen, naproxen, or celecoxib. ? Vitamin supplements that contain vitamin A, vitamin E, or both. ? Hormone replacement therapy that contains estrogen with or without progestin. Lifestyle  Follow an exercise program approved by your health care provider. Aim for 150 minutes of moderate exercise or 75 minutes of vigorous exercise each week.  Maintain a healthy weight or lose weight as approved by your health care provider.  Rest when you are tired.  Learn to manage stress or try to limit your stress. Ask your health care provider for suggestions if you need help.  Get screened for depression and seek treatment, if needed.  Do not use any products that contain nicotine or tobacco, such as cigarettes and e-cigarettes. If you need help quitting, ask your health care provider.  Do not use illegal drugs. Eating and drinking  Follow a heart-healthy diet. A dietitian can help educate you about healthy food options and changes. In general, eat plenty of fruits and vegetables, lean meats, and whole grains.  Avoid foods high in: ? Sugar. ? Salt (sodium). ? Saturated fats, such as processed or fatty meat. ? Trans fats, such as fried food.  Use healthy cooking methods such as roasting,  grilling, broiling, baking, poaching, steaming, or stir-frying.  If you drink alcohol, and your health care provider approves, limit your alcohol intake to no more than 1 drink per day. One drink equals 12 ounces of beer, 5 ounces of wine, or 1 ounces of hard liquor. General instructions  Manage any other health conditions, such as hypertension and diabetes. These conditions affect your heart.  Your health care provider may ask you to monitor your blood pressure. Ideally, your blood pressure should be below 130/80.  Keep all follow-up visits as told by your health care provider. This is important. Get help right away if:  You have pain in your chest, neck, arm, jaw, stomach, or back that: ? Lasts more than a few minutes. ? Is recurring. ? Is not relieved by taking medicine under your tongue (sublingualnitroglycerin).  You have profuse sweating without cause.  You have unexplained: ? Heartburn or indigestion. ? Shortness of breath or difficulty breathing. ? Fluttering or fast heartbeat (palpitations). ? Nausea or vomiting. ? Fatigue. ? Feelings of nervousness or anxiety. ? Weakness. ? Diarrhea.  You have sudden light-headedness or dizziness.  You faint.  You feel like hurting yourself or think about taking your own life. These symptoms may represent a serious problem that is an   emergency. Do not wait to see if the symptoms will go away. Get medical help right away. Call your local emergency services (911 in the [place] Do not drive yourself to the hospital. Summary  Coronary artery disease (CAD) is a process in which the arteries that lead to the heart (coronary arteries) become narrow or blocked. The narrowing or blockage can lead to a heart attack.  Many women have chest discomfort and other common symptoms of CAD. However, women often have different (atypical) symptoms, such as fatigue, vomiting, and dizziness or weakness.  CAD can be treated with lifestyle changes,  medicines, surgery, or a combination of these treatments. This information is not intended to replace advice given to you by your health care provider. Make sure you discuss any questions you have with your health care provider. Document Released: 09/21/2011 Document Revised: 06/19/2016 Document Reviewed: 06/19/2016 Elsevier Interactive Patient Education  2019 [employer]    Radial Site Care  This sheet gives you information about how to care for yourself after your procedure. Your health care provider may also give you more specific instructions. If you have problems or questions, contact your health care provider. What can I expect after the procedure? After the procedure, it is common to have:  Bruising and tenderness at the catheter insertion area. Follow these instructions at home: Medicines  Take over-the-counter and prescription medicines only as told by your health care provider. Insertion site care  Follow instructions from your health care provider about how to take care of your insertion site. Make sure you: ? Wash your hands with soap and water before you change your bandage (dressing). If soap and water are not available, use hand sanitizer. ? Change your dressing as told by your health care provider. ? Leave stitches (sutures), skin glue, or adhesive strips in place. These skin closures may need to stay in place for 2 weeks or longer. If adhesive strip edges start to loosen and curl up, you may trim the loose edges. Do not remove adhesive strips completely unless your health care provider tells you to do that.  Check your insertion site every day for signs of infection. Check for: ? Redness, swelling, or pain. ? Fluid or blood. ? Pus or a bad smell. ? Warmth.  Do not take baths, swim, or use a hot tub until your health care provider approves.  You may shower 24-48 hours after the procedure, or as directed by your health care provider. ? Remove the dressing and  gently wash the site with plain soap and water. ? Pat the area dry with a clean towel. ? Do not rub the site. That could cause bleeding.  Do not apply powder or lotion to the site. Activity   For 24 hours after the procedure, or as directed by your health care provider: ? Do not flex or bend the affected arm. ? Do not push or pull heavy objects with the affected arm. ? Do not drive yourself home from the hospital or clinic. You may drive 24 hours after the procedure unless your health care provider tells you not to. ? Do not operate machinery or power tools.  Do not lift anything that is heavier than 10 lb (4.5 kg), or the limit that you are told, until your health care provider says that it is safe.  Ask your health care provider when it is okay to: ? Return to work or school. ? Resume usual physical activities or sports. ? Resume sexual activity. General  instructions  If the catheter site starts to bleed, raise your arm and put firm pressure on the site. If the bleeding does not stop, get help right away. This is a medical emergency.  If you went home on the same day as your procedure, a responsible adult should be with you for the first 24 hours after you arrive home.  Keep all follow-up visits as told by your health care provider. This is important. Contact a health care provider if:  You have a fever.  You have redness, swelling, or yellow drainage around your insertion site. Get help right away if:  You have unusual pain at the radial site.  The catheter insertion area swells very fast.  The insertion area is bleeding, and the bleeding does not stop when you hold steady pressure on the area.  Your arm or hand becomes pale, cool, tingly, or numb. These symptoms may represent a serious problem that is an emergency. Do not wait to see if the symptoms will go away. Get medical help right away. Call your local emergency services (911 in the [place] Do not drive yourself to  the hospital. Summary  After the procedure, it is common to have bruising and tenderness at the site.  Follow instructions from your health care provider about how to take care of your radial site wound. Check the wound every day for signs of infection.  Do not lift anything that is heavier than 10 lb (4.5 kg), or the limit that you are told, until your health care provider says that it is safe. This information is not intended to replace advice given to you by your health care provider. Make sure you discuss any questions you have with your health care provider. Document Released: 08/01/2010 Document Revised: 08/04/2017 Document Reviewed: 08/04/2017 Elsevier Interactive Patient Education  2019 [employer].

## 2018-08-29 ENCOUNTER — Encounter (HOSPITAL_COMMUNITY): Payer: Self-pay | Admitting: Cardiovascular Disease

## 2018-08-29 LAB — GLUCOSE, CAPILLARY: Glucose-Capillary: 97 mg/dL (ref 70–99)

## 2018-10-10 ENCOUNTER — Telehealth: Payer: Self-pay

## 2018-10-10 NOTE — Telephone Encounter (Signed)
   Cardiac Questionnaire:    Since your last visit or hospitalization:    1. Have you been having new or worsening chest pain? NO   2. Have you been having new or worsening shortness of breath? NO 3. Have you been having new or worsening leg swelling, wt gain, or increase in abdominal girth (pants fitting more tightly)? NO   4. Have you had any passing out spells? NO    *A YES to any of these questions would result in the appointment being kept. *If all the answers to these questions are NO, we should indicate that given the current situation regarding the worldwide coronarvirus pandemic, at the recommendation of the CDC, we are looking to limit gatherings in our waiting area, and thus will reschedule their appointment beyond four weeks from today.   _____________   COVID-19 Pre-Screening Questions:  . Do you currently have a fever? NO (yes = cancel and refer to pcp for e-visit) . Have you recently travelled on a cruise, internationally, or to [place], [place], [place], [place], [place], or [place], [place] [employer]) ? NO (yes = cancel, stay home, monitor symptoms, and contact pcp or initiate e-visit if symptoms develop) . Have you been in contact with someone that is currently pending confirmation of Covid19 testing or has been confirmed to have the Covid19 virus?  NO (yes = cancel, stay home, away from tested individual, monitor symptoms, and contact pcp or initiate e-visit if symptoms develop) . Are you currently experiencing fatigue or cough? NO (yes = pt should be prepared to have a mask placed at the time of their visit).      Appointment Cancelled due to Coronavirus:  Called patient in regards to  f/u appointment with Lizabeth Leyden, NP on 10/14/2018.   Patient denies having any chest pain, SOB, cough, fever, or any other Sx.   Patient was okay with cancelling appointment and has been made aware that they will be contacted in the near future to reschedule.   Refills have been sent in if needed.    Patient understands to let us know if they develop any Sx before then.

## 2018-10-14 ENCOUNTER — Ambulatory Visit: Payer: BLUE CROSS/BLUE SHIELD | Admitting: Cardiology

## 2018-10-25 ENCOUNTER — Other Ambulatory Visit: Payer: Self-pay

## 2018-10-25 ENCOUNTER — Encounter: Payer: BLUE CROSS/BLUE SHIELD | Attending: General Surgery | Admitting: Skilled Nursing Facility1

## 2018-10-25 ENCOUNTER — Ambulatory Visit: Payer: Self-pay

## 2018-10-25 DIAGNOSIS — E119 Type 2 diabetes mellitus without complications: Secondary | ICD-10-CM | POA: Diagnosis not present

## 2018-10-25 NOTE — Patient Instructions (Addendum)
-  Start back taking calcium 3 times a day  -Switch to a multi capsule; be sure to have food on your stomach first   -You can cut your protein shake down to 1 a day   -Use your smaller plate at home for meals  -Do your workouts on your days off involving resistance   -Incorporate starchy vegetables and fruit back into your day

## 2018-10-25 NOTE — Progress Notes (Signed)
Follow-up visit:  Post-Operative RYGB Surgery  Primary concerns today: Post-operative Bariatric Surgery Nutrition Management.  Pt has Hearing Aids: she did not have them today so she needed dietitian to speak loudly.  Pt states she has started to have hair thinning starting about 3 weeks ago. Pt states she does not weigh herself. Pt states sometimes she experiences nausea with eating past fullness. Pt states she thinks if she puts less on her plate maybe she would not eat past fullness. Pt states she really likes water now. Pt states she is very busy with work on her feet. Pt states she has not been exercising due to 10.5 hour shifts. Pt states she was able to get up easily out of the tub for the first time which she is very excited about. Pt states after work she still has energy.    Body Composition Scale 10/25/2018  Total Body Fat % 40.6  Visceral Fat 12  Fat-Free Mass % 59.3   Total Body Water % 44.1   Muscle-Mass lbs 30.8  Body Fat Displacement          Torso  lbs 51.6         Left Leg  lbs 10.3         Right Leg  lbs 10.3         Left Arm  lbs 5.1         Right Arm   lbs 5.1    Surgery date: 04/25/2018 Surgery type: RYGB [facility] Weight today: 205.5 Wt change: 35.7  24-hr recall: eating every 3 hours; 2 protein drinks a day B (AM): yogurt and protein shake Snk (AM): cheese or fruit L (PM): cottage cheese with sugar free jello or chicken with carrots or green beans Snk (PM): 1 pickle  D (PM): 5 ounces chicken with carrots or green beans popcicle or jello  Fluid intake: water, diet green tea, unsweet tea: 60-70 oz Estimated total protein intake: 60+  Medications: off blood pressure and diabetes medicines Supplementation: celebrate and no calcium   CBG monitoring: randomly throughout the week Average CBG per patient: averaging 112 Last patient reported A1c:   Using straws: no Drinking while eating: yes Having you been chewing  well:no Chewing/swallowing difficulties: no Changes in vision: no Changes to mood/headaches: no Hair loss/Cahnges to skin/Changes to nails: no Any difficulty focusing or concentrating: no Sweating: no Dizziness/Lightheaded: no Palpitations: no  Carbonated beverages: no N/V/D/C/GAS: some nausea with overeating Abdominal Pain: no Dumping syndrome: no  Recent physical activity:  ADL's  Progress Towards Goal(s):  In progress.    Intervention:  Nutrition counseling. Pts diet was advanced to the next phase now including non starchy vegetables. Due to the bodies need for essential vitamins, minerals, and fats the pt was educated on the need to consume a certain amount of calories as well as certain nutrients daily. Pt was educated on the need for daily physical activity and to reach a goal of at least 150 minutes of moderate to vigorous physical activity as directed by their physician due to such benefits as increased musculature and improved lab values.   Goals: -Start back taking calcium 3 times a day -Switch to a multi capsule; be sure to have food on your stomach first  -You can cut your protein shake down to 1 a day  -Use your smaller plate at home for meals -Do your workouts on your days off involving resistance  -Incorporate starchy vegetables and fruit back into  your day   Teaching Method Utilized:  Visual Auditory Hands on  Barriers to learning/adherence to lifestyle change: none identified   Demonstrated degree of understanding via:  Teach Back   Monitoring/Evaluation:  Dietary intake, exercise, and body weight. Follow up in 3 months

## 2018-10-31 ENCOUNTER — Telehealth: Payer: Self-pay

## 2018-10-31 NOTE — Telephone Encounter (Signed)
Please contact patient to set up for a virtual visit with Jeraldine Loots if agreeable.  If not put in recall for August

## 2018-10-31 NOTE — Telephone Encounter (Signed)
Attempted to reach pt to see if she was interested in doing a virtual visit with Lizabeth Leyden, Np on 4/28, Lvm. Call enacted due to restrictions of covid-19.

## 2019-05-05 DIAGNOSIS — M25774 Osteophyte, right foot: Secondary | ICD-10-CM | POA: Diagnosis not present

## 2019-05-05 DIAGNOSIS — L600 Ingrowing nail: Secondary | ICD-10-CM | POA: Diagnosis not present

## 2019-05-05 DIAGNOSIS — M25775 Osteophyte, left foot: Secondary | ICD-10-CM | POA: Diagnosis not present

## 2019-05-19 DIAGNOSIS — L600 Ingrowing nail: Secondary | ICD-10-CM | POA: Diagnosis not present

## 2019-05-19 DIAGNOSIS — T8484XA Pain due to internal orthopedic prosthetic devices, implants and grafts, initial encounter: Secondary | ICD-10-CM | POA: Diagnosis not present

## 2019-06-12 ENCOUNTER — Encounter

## 2019-10-27 DIAGNOSIS — E119 Type 2 diabetes mellitus without complications: Secondary | ICD-10-CM | POA: Diagnosis not present

## 2019-10-27 DIAGNOSIS — I10 Essential (primary) hypertension: Secondary | ICD-10-CM | POA: Diagnosis not present

## 2019-10-27 DIAGNOSIS — E782 Mixed hyperlipidemia: Secondary | ICD-10-CM | POA: Diagnosis not present

## 2019-10-27 DIAGNOSIS — H1033 Unspecified acute conjunctivitis, bilateral: Secondary | ICD-10-CM | POA: Diagnosis not present

## 2019-10-30 DIAGNOSIS — R944 Abnormal results of kidney function studies: Secondary | ICD-10-CM | POA: Diagnosis not present

## 2019-10-30 DIAGNOSIS — I2510 Atherosclerotic heart disease of native coronary artery without angina pectoris: Secondary | ICD-10-CM | POA: Diagnosis not present

## 2019-10-30 DIAGNOSIS — I10 Essential (primary) hypertension: Secondary | ICD-10-CM | POA: Diagnosis not present

## 2019-10-30 DIAGNOSIS — E1169 Type 2 diabetes mellitus with other specified complication: Secondary | ICD-10-CM | POA: Diagnosis not present

## 2019-11-17 ENCOUNTER — Encounter (HOSPITAL_COMMUNITY): Payer: Self-pay

## 2020-03-29 ENCOUNTER — Encounter: Payer: Self-pay | Admitting: Cardiovascular Disease

## 2020-04-26 DIAGNOSIS — E782 Mixed hyperlipidemia: Secondary | ICD-10-CM | POA: Diagnosis not present

## 2020-04-26 DIAGNOSIS — I10 Essential (primary) hypertension: Secondary | ICD-10-CM | POA: Diagnosis not present

## 2020-04-26 DIAGNOSIS — E119 Type 2 diabetes mellitus without complications: Secondary | ICD-10-CM | POA: Diagnosis not present

## 2020-04-26 DIAGNOSIS — E1169 Type 2 diabetes mellitus with other specified complication: Secondary | ICD-10-CM | POA: Diagnosis not present

## 2020-04-29 DIAGNOSIS — E1169 Type 2 diabetes mellitus with other specified complication: Secondary | ICD-10-CM | POA: Diagnosis not present

## 2020-04-29 DIAGNOSIS — I10 Essential (primary) hypertension: Secondary | ICD-10-CM | POA: Diagnosis not present

## 2020-04-29 DIAGNOSIS — I2510 Atherosclerotic heart disease of native coronary artery without angina pectoris: Secondary | ICD-10-CM | POA: Diagnosis not present

## 2020-04-29 DIAGNOSIS — R944 Abnormal results of kidney function studies: Secondary | ICD-10-CM | POA: Diagnosis not present

## 2020-11-06 DIAGNOSIS — E119 Type 2 diabetes mellitus without complications: Secondary | ICD-10-CM | POA: Diagnosis not present

## 2020-11-06 DIAGNOSIS — I10 Essential (primary) hypertension: Secondary | ICD-10-CM | POA: Diagnosis not present

## 2020-11-06 DIAGNOSIS — E782 Mixed hyperlipidemia: Secondary | ICD-10-CM | POA: Diagnosis not present

## 2020-11-11 DIAGNOSIS — I10 Essential (primary) hypertension: Secondary | ICD-10-CM | POA: Diagnosis not present

## 2020-11-11 DIAGNOSIS — I2510 Atherosclerotic heart disease of native coronary artery without angina pectoris: Secondary | ICD-10-CM | POA: Diagnosis not present

## 2020-11-11 DIAGNOSIS — R944 Abnormal results of kidney function studies: Secondary | ICD-10-CM | POA: Diagnosis not present

## 2020-11-11 DIAGNOSIS — E1169 Type 2 diabetes mellitus with other specified complication: Secondary | ICD-10-CM | POA: Diagnosis not present

## 2020-11-21 ENCOUNTER — Encounter (HOSPITAL_COMMUNITY): Payer: Self-pay | Admitting: *Deleted

## 2021-08-14 DIAGNOSIS — I10 Essential (primary) hypertension: Secondary | ICD-10-CM | POA: Diagnosis not present

## 2021-08-14 DIAGNOSIS — E119 Type 2 diabetes mellitus without complications: Secondary | ICD-10-CM | POA: Diagnosis not present

## 2021-08-21 DIAGNOSIS — E782 Mixed hyperlipidemia: Secondary | ICD-10-CM | POA: Diagnosis not present

## 2021-08-21 DIAGNOSIS — E119 Type 2 diabetes mellitus without complications: Secondary | ICD-10-CM | POA: Diagnosis not present

## 2021-08-21 DIAGNOSIS — I10 Essential (primary) hypertension: Secondary | ICD-10-CM | POA: Diagnosis not present

## 2021-08-21 DIAGNOSIS — N183 Chronic kidney disease, stage 3 unspecified: Secondary | ICD-10-CM | POA: Diagnosis not present

## 2022-06-15 ENCOUNTER — Other Ambulatory Visit (HOSPITAL_COMMUNITY): Payer: Self-pay | Admitting: Family Medicine

## 2022-06-15 DIAGNOSIS — Z13820 Encounter for screening for osteoporosis: Secondary | ICD-10-CM

## 2022-06-16 ENCOUNTER — Other Ambulatory Visit: Payer: Self-pay | Admitting: Family Medicine

## 2022-06-16 DIAGNOSIS — Z1231 Encounter for screening mammogram for malignant neoplasm of breast: Secondary | ICD-10-CM

## 2022-06-24 ENCOUNTER — Telehealth: Payer: Self-pay | Admitting: Cardiovascular Disease

## 2022-06-24 NOTE — Telephone Encounter (Signed)
Patient came in today to get reestablished with our office. Patient stated she would like to see Dr Excell Seltzer but I made her aware he is not accepting new patients, but instead set her up with Dr Clifton James who did her cath in 2020 per her request. She asked if I would mention to Dr Excell Seltzer her name to see if she could see him instead. She has appt on 08/17/22 @ 9:40am. Please advise.

## 2022-06-26 ENCOUNTER — Ambulatory Visit (HOSPITAL_COMMUNITY)
Admission: RE | Admit: 2022-06-26 | Discharge: 2022-06-26 | Disposition: A | Discharge status: Home / Self Care | Payer: Medicare Other | Source: Ambulatory Visit | Attending: Family Medicine | Admitting: Family Medicine

## 2022-06-26 DIAGNOSIS — Z780 Asymptomatic menopausal state: Secondary | ICD-10-CM | POA: Diagnosis not present

## 2022-06-26 DIAGNOSIS — Z1231 Encounter for screening mammogram for malignant neoplasm of breast: Secondary | ICD-10-CM | POA: Diagnosis present

## 2022-06-26 DIAGNOSIS — Z13820 Encounter for screening for osteoporosis: Secondary | ICD-10-CM | POA: Diagnosis present

## 2022-06-26 DIAGNOSIS — M810 Age-related osteoporosis without current pathological fracture: Secondary | ICD-10-CM | POA: Insufficient documentation

## 2022-06-29 NOTE — Telephone Encounter (Signed)
Dr. Excell Seltzer has agreed to have patient reestablish care with him.  She has been scheduled with him 08/18/22.

## 2022-08-17 ENCOUNTER — Ambulatory Visit: Payer: BLUE CROSS/BLUE SHIELD | Admitting: Cardiovascular Disease

## 2022-08-18 ENCOUNTER — Ambulatory Visit: Payer: Medicare Other | Attending: Cardiovascular Disease | Admitting: Cardiovascular Disease

## 2022-08-18 ENCOUNTER — Ambulatory Visit (INDEPENDENT_AMBULATORY_CARE_PROVIDER_SITE_OTHER): Payer: Medicare Other

## 2022-08-18 ENCOUNTER — Encounter: Payer: Self-pay | Admitting: Cardiovascular Disease

## 2022-08-18 VITALS — BP 144/94 | HR 59 | Ht 66.5 in | Wt 190.6 lb

## 2022-08-18 DIAGNOSIS — I10 Essential (primary) hypertension: Secondary | ICD-10-CM

## 2022-08-18 DIAGNOSIS — I2510 Atherosclerotic heart disease of native coronary artery without angina pectoris: Secondary | ICD-10-CM | POA: Diagnosis not present

## 2022-08-18 DIAGNOSIS — E782 Mixed hyperlipidemia: Secondary | ICD-10-CM

## 2022-08-18 DIAGNOSIS — R002 Palpitations: Secondary | ICD-10-CM

## 2022-08-18 DIAGNOSIS — R011 Cardiac murmur, unspecified: Secondary | ICD-10-CM

## 2022-08-18 MED ORDER — ROSUVASTATIN CALCIUM 10 MG PO TABS: 10.0000 mg | ORAL_TABLET | Freq: Every day | ORAL | 3 refills | Status: DC

## 2022-08-18 MED ORDER — NITROGLYCERIN 0.4 MG SL SUBL: 0.4000 mg | SUBLINGUAL_TABLET | SUBLINGUAL | 3 refills | Status: AC | PRN

## 2022-08-18 NOTE — Patient Instructions (Signed)
Medication Instructions:  START Crestor (Rosuvastatin) 10mg  daily *If you need a refill on your cardiac medications before your next appointment, please call your pharmacy*   Lab Work: Lipids, Liver in 3 months If you have labs (blood work) drawn today and your tests are completely normal, you will receive your results only by: [place] (if you have MyChart) OR A paper copy in the mail If you have any lab test that is abnormal or we need to change your treatment, we will call you to review the results.   Testing/Procedures: ECHO Your physician has requested that you have an echocardiogram. Echocardiography is a painless test that uses sound waves to create images of your heart. It provides your doctor with information about the size and shape of your heart and how well your heart's chambers and valves are working. This procedure takes approximately one hour. There are no restrictions for this procedure. Please do NOT wear cologne, perfume, aftershave, or lotions (deodorant is allowed). Please arrive 15 minutes prior to your appointment time.  14-day Zio Your physician has recommended that you wear an event monitor. Event monitors are medical devices that record the heart's electrical activity. Doctors most often [place] these monitors to diagnose arrhythmias. Arrhythmias are problems with the speed or rhythm of the heartbeat. The monitor is a small, portable device. You can wear one while you do your normal daily activities. This is usually used to diagnose what is causing palpitations/syncope (passing out).  Follow-Up: At [facility], you and your health needs are our priority.  As part of our continuing mission to provide you with exceptional heart care, we have created designated Provider Care Teams.  These Care Teams include your primary Cardiologist (physician) and Advanced Practice Providers (APPs -  Physician Assistants and Nurse Practitioners) who all work together to  provide you with the care you need, when you need it.  We recommend signing up for the patient portal called "MyChart".  Sign up information is provided on this After Visit Summary.  MyChart is used to connect with patients for Virtual Visits (Telemedicine).  Patients are able to view lab/test results, encounter notes, upcoming appointments, etc.  Non-urgent messages can be sent to your provider as well.   To learn more about what you can do with MyChart, go to [email or web address].    Your next appointment:   1 year(s)  Provider:   Sherren Mocha, MD       Other Instructions Bryn Gulling- Long Term Monitor Instructions  Your physician has requested you wear a ZIO patch monitor for 14 days.  This is a single patch monitor. [employer] supplies one patch monitor per enrollment. Additional stickers are not available. Please do not apply patch if you will be having a Nuclear Stress Test,  Echocardiogram, Cardiac CT, MRI, or Chest Xray during the period you would be wearing the  monitor. The patch cannot be worn during these tests. You cannot remove and re-apply the  ZIO XT patch monitor.  Your ZIO patch monitor will be mailed 3 day USPS to your address on file. It may take 3-5 days  to receive your monitor after you have been enrolled.  Once you have received your monitor, please review the enclosed instructions. Your monitor  has already been registered assigning a specific monitor serial # to you.  Billing and Patient Assistance Program Information  We have supplied [employer] with any of your insurance information on file for billing purposes. [employer] offers a sliding scale Patient  Assistance Program for patients that do not have  insurance, or whose insurance does not completely cover the cost of the ZIO monitor.  You must apply for the Patient Assistance Program to qualify for this discounted rate.  To apply, please call [employer] at [phone], select option 4, select option 2, ask to apply  for  Patient Assistance Program. Theodore Demark will ask your household income, and how many people  are in your household. They will quote your out-of-pocket cost based on that information.  [employer] will also be able to set up a 30-month, interest-free payment plan if needed.  Applying the monitor   Shave hair from upper left chest.  Hold abrader disc by orange tab. Rub abrader in 40 strokes over the upper left chest as  indicated in your monitor instructions.  Clean area with 4 enclosed alcohol pads. Let dry.  Apply patch as indicated in monitor instructions. Patch will be placed under collarbone on left  side of chest with arrow pointing upward.  Rub patch adhesive wings for 2 minutes. Remove white label marked "1". Remove the white  label marked "2". Rub patch adhesive wings for 2 additional minutes.  While looking in a mirror, press and release button in center of patch. A small green light will  flash 3-4 times. This will be your only indicator that the monitor has been turned on.  Do not shower for the first 24 hours. You may shower after the first 24 hours.  Press the button if you feel a symptom. You will hear a small click. Record Date, Time and  Symptom in the Patient Logbook.  When you are ready to remove the patch, follow instructions on the last 2 pages of Patient  Logbook. Stick patch monitor onto the last page of Patient Logbook.  Place Patient Logbook in the blue and white box. Use locking tab on box and tape box closed  securely. The blue and white box has prepaid postage on it. Please place it in the mailbox as  soon as possible. Your physician should have your test results approximately 7 days after the  monitor has been mailed back to [facility].  Call [place] at [phone] if you have questions regarding  your ZIO XT patch monitor. Call them immediately if you see an orange light blinking on your  monitor.  If your monitor falls off in less than  4 days, contact our Monitor department at [phone].  If your monitor becomes loose or falls off after 4 days call [employer] at [phone] for  suggestions on securing your monitor

## 2022-08-18 NOTE — Progress Notes (Unsigned)
Cardiology Office Note:    Date:  08/19/2022   ID:  Felicia Silva, Felicia Silva 1956/04/29, MRN [id number]  PCP:  Celene Squibb, [place] [facility] Cardiologist:  Sherren Mocha, MD     Referring MD: Celene Squibb, MD   Chief Complaint  Patient presents with   Coronary Artery Disease    History of Present Illness:    Felicia Silva is a 67 y.o. female with a hx of coronary artery disease, presenting for follow-up evaluation.  The patient initially presented in 2018 with a STEMI involving the left circumflex.  She was treated with primary PCI using drug-eluting stent and had an uncomplicated initial hospital course.  She was noted to have moderate nonobstructive stenosis in the LAD with recommendations for medical therapy.  LVEF at the time of her MI is 55 to 60%.  She underwent repeat catheterization in 2020 for concerns over unstable angina and was found to have stable coronary anatomy with continued patency of the circumflex stent and no change in moderate nonobstructive LAD stenosis.  Since have seen the patient last, she underwent bariatric surgery and has lost a good bit of weight.  The patient is here with her husband today after a hiatus of several years. She reports some 'dizzy spells' and has been concerned about this. She had an episode of syncope that occurred after she drank a bottle of magnesium citrate to treat constipation. She had another episode in 2020 when she took tylenol with codeine and feels like she might have had a reaction to it. This caused chest pain and ultimately led to her heart catheterization demonstrating stable coronary anatomy. Finally, she had another episode of syncope after getting in a hot bath tub and her husband had to briefly perform CPR because she looked like she wasn't breathing and she was completely unresponsive. She quickly regained consciousness.  The patient complains of heart palpitations.  She denies chest pain or pressure.  She  denies shortness of breath with activity.  No orthopnea, PND, or leg swelling.  Past Medical History:  Diagnosis Date   CAD (coronary artery disease), native coronary artery    11/28/16 STEMI PCI DES--LCx, EF normal.    Complication of anesthesia    Diabetes mellitus without complication ([place])    Hyperlipidemia associated with type 2 diabetes mellitus (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   Procedure: LAPAROSCOPIC ROUX-EN-Y GASTRIC BYPASS WITH UPPER ENDOSCOPY AND ERAS PATHWAY;  Surgeon: Excell Seltzer, MD;  Location: [facility];  Service: General;  Laterality: N/A;   HERNIA REPAIR     LEFT HEART CATH AND CORONARY ANGIOGRAPHY N/A 11/28/2016   Procedure: Left Heart Cath and Coronary Angiography;  Surgeon: Sherren Mocha, MD;  Location: [facility];  Service: Cardiovascular;  Laterality: N/A;   LEFT HEART CATH AND CORONARY ANGIOGRAPHY N/A 08/26/2018   Procedure: LEFT HEART CATH AND CORONARY ANGIOGRAPHY;  Surgeon: Burnell Blanks, MD;  Location: [facility];  Service: Cardiovascular;  Laterality: N/A;   TONSILLECTOMY      Current Medications: Current Meds  Medication Sig   amLODipine (NORVASC) 5 MG tablet Take 5 mg by mouth at bedtime.   aspirin 81 MG chewable tablet Chew 81 mg by mouth daily.   calcium carbonate (TUMS - DOSED IN MG ELEMENTAL CALCIUM) 500 MG chewable tablet Chew 1 tablet by mouth daily.   EPINEPHrine 0.3 mg/0.3 mL IJ SOAJ injection INJECT 1 PEN INTO THE MUSCLE FOR  ANAPHYLAXIS   metoprolol tartrate (LOPRESSOR) 25 MG tablet Take 1 tablet (25 mg total) by mouth 2 (two) times daily.   Multiple Vitamin (MULTIVITAMIN WITH MINERALS) TABS tablet Take 1 tablet by mouth daily. ODT   protein supplement shake (PREMIER PROTEIN) LIQD Take 59.1 mLs (2 oz total) by mouth every 2 (two) hours.   rosuvastatin (CRESTOR) 10 MG tablet Take 1 tablet (10 mg total) by mouth daily.   [DISCONTINUED] acetaminophen-codeine (TYLENOL #3) 300-30 MG tablet Take 1 tablet by mouth as needed.   [DISCONTINUED] atorvastatin (LIPITOR) 80 MG tablet Take 1 tablet (80 mg total) by mouth daily at 6 PM.   [DISCONTINUED] isosorbide mononitrate (IMDUR) 30 MG 24 hr tablet Take 1 tablet (30 mg total) by mouth daily.   [DISCONTINUED] nitroGLYCERIN (NITROSTAT) 0.4 MG SL tablet Place 1 tablet (0.4 mg total) under the tongue every 5 (five) minutes x 3 doses as needed for chest pain.     Allergies:   Shellfish allergy and Blood-group specific substance   Social History   Socioeconomic History   Marital status: Married    Spouse name: Not on file   Number of children: Not on file   Years of education: Not on file   Highest education level: Not on file  Occupational History   Occupation: Pharmacy tech    Employer: [employer]  Tobacco Use   Smoking status: Never   Smokeless tobacco: Never  Vaping Use   Vaping Use: Never used  Substance and Sexual Activity   Alcohol use: No   Drug use: No   Sexual activity: Not on file  Other Topics Concern   Not on file  Social History Narrative   Nonsmoker   Lives with husband in [place].    Social Determinants of Health   Financial Resource Strain: Not on file  Food Insecurity: Not on file  Transportation Needs: Not on file  Physical Activity: Not on file  Stress: Not on file  Social Connections: Not on file     Family History: The patient's family history includes Heart attack (age of onset: 105) in her father; Heart murmur in her mother; Pancreatic  cancer in her mother.  ROS:   Please see the history of present illness.    All other systems reviewed and are negative.  EKGs/Labs/Other Studies Reviewed:    The following studies were reviewed today: Cardiac Cath 08/26/2018: Previously placed Mid Cx drug eluting stent is widely patent. Mid RCA lesion is 20% stenosed. Dist RCA lesion is 30% stenosed. Prox LAD lesion is 50% stenosed. The left ventricular systolic function is normal. LV end diastolic pressure is normal. The left ventricular ejection fraction is 55-65% by visual estimate. There is no mitral valve regurgitation.   1. Patent stent mid to distal Circumflex artery without restenosis 2. Moderate mid LAD stenosis. This is unchanged from her last cath and does not appear to be flow limiting.  3. Mild non-obstructive disease in the mid and distal RCA  4. Preserved LV systolic function   Recommendations: Continue medical management of CAD  EKG:  EKG is ordered today.  The ekg ordered today demonstrates sinus rhythm 59 bpm, first-degree AV block, otherwise normal  Recent Labs: No results found for requested labs within last 365 days.  Recent Lipid Panel    Component Value Date/Time   CHOL 96 08/26/2018 1141   CHOL 130 12/15/2017 1353   TRIG 44 08/26/2018 1141   HDL 38 (L) 08/26/2018 1141   HDL 45 12/15/2017 1353   CHOLHDL 2.5 08/26/2018 1141   VLDL 9 08/26/2018 1141   LDLCALC 49 08/26/2018 1141   LDLCALC 59 12/15/2017 1353     Risk Assessment/Calculations:           Physical Exam:    VS:  BP (!) 144/94   Pulse (!) 59   Ht 5' 6.5" (1.689 m)   Wt 190 lb 9.6 oz (86.5 kg)   SpO2 98%   BMI 30.30 kg/m     Wt Readings from Last 3 Encounters:  08/18/22 190 lb 9.6 oz (86.5 kg)  10/25/18 205 lb 8 oz (93.2 kg)  08/27/18 216 lb 11.4 oz (98.3 kg)     GEN:  Well nourished, well developed in no acute distress HEENT: Normal NECK: No JVD; No carotid bruits LYMPHATICS: No lymphadenopathy CARDIAC: RRR, 2/6 early  peaking systolic murmur at the RUSB RESPIRATORY:  Clear to auscultation without rales, wheezing or rhonchi  ABDOMEN: Soft, non-tender, non-distended MUSCULOSKELETAL:  No edema; No deformity  SKIN: Warm and dry NEUROLOGIC:  Alert and oriented x 3 PSYCHIATRIC:  Normal affect   ASSESSMENT:    1. Coronary artery disease involving native coronary artery of native heart without angina pectoris   2. Essential hypertension   3. Mixed hyperlipidemia   4. Palpitations   5. Murmur, cardiac    PLAN:    In order of problems listed above:  Stable without symptoms of angina.  Continue aspirin for antiplatelet therapy, amlodipine and metoprolol for antianginal treatment, and see below for discussion of lipid-lowering therapy. Blood pressure has been in the low normal range at home.  Suspect today's blood pressure elevated related to whitecoat syndrome.  Continue amlodipine and low-dose metoprolol. Lipids reviewed with cholesterol 167, HDL 67, LDL 85.  She has been off of statin therapy for 1 year now because she was having right upper quadrant pain and was concerned about her liver.  Will try her back on rosuvastatin 10 mg and repeat lipids and LFTs in 3 months. Recommend check 14-day ZIO monitor and 2D echo Check 2D echo, suspect LV outflow murmur.     Medication Adjustments/Labs and Tests Ordered: Current medicines are reviewed at length with the patient today.  Concerns regarding medicines are outlined above.  Orders Placed This Encounter  Procedures   Lipid panel   Hepatic function panel   LONG TERM MONITOR (3-14 DAYS)   EKG 12-Lead   ECHOCARDIOGRAM COMPLETE   Meds ordered this encounter  Medications   nitroGLYCERIN (NITROSTAT) 0.4 MG SL tablet    Sig: Place 1 tablet (0.4 mg total) under the tongue every 5 (five) minutes x 3 doses as needed for chest pain.    Dispense:  25 tablet    Refill:  3   rosuvastatin (CRESTOR) 10 MG tablet    Sig: Take 1 tablet (10 mg total) by mouth daily.     Dispense:  90 tablet    Refill:  3    Patient Instructions  Medication  Instructions:  START Crestor (Rosuvastatin) 10mg  daily *If you need a refill on your cardiac medications before your next appointment, please call your pharmacy*   Lab Work: Lipids, Liver in 3 months If you have labs (blood work) drawn today and your tests are completely normal, you will receive your results only by: MyChart Message (if you have MyChart) OR A paper copy in the mail If you have any lab test that is abnormal or we need to change your treatment, we will call you to review the results.   Testing/Procedures: ECHO Your physician has requested that you have an echocardiogram. Echocardiography is a painless test that uses sound waves to create images of your heart. It provides your doctor with information about the size and shape of your heart and how well your heart's chambers and valves are working. This procedure takes approximately one hour. There are no restrictions for this procedure. Please do NOT wear cologne, perfume, aftershave, or lotions (deodorant is allowed). Please arrive 15 minutes prior to your appointment time.  14-day Zio Your physician has recommended that you wear an event monitor. Event monitors are medical devices that record the heart's electrical activity. Doctors most often these monitors to diagnose arrhythmias. Arrhythmias are problems with the speed or rhythm of the heartbeat. The monitor is a small, portable device. You can wear one while you do your normal daily activities. This is usually used to diagnose what is causing palpitations/syncope (passing out).  Follow-Up: At [facility], you and your health needs are our priority.  As part of our continuing mission to provide you with exceptional heart care, we have created designated Provider Care Teams.  These Care Teams include your primary Cardiologist (physician) and Advanced Practice Providers (APPs -  Physician  Assistants and Nurse Practitioners) who all work together to provide you with the care you need, when you need it.  We recommend signing up for the patient portal called "MyChart".  Sign up information is provided on this After Visit Summary.  MyChart is used to connect with patients for Virtual Visits (Telemedicine).  Patients are able to view lab/test results, encounter notes, upcoming appointments, etc.  Non-urgent messages can be sent to your provider as well.   To learn more about what you can do with MyChart, go to [facility].    Your next appointment:   1 year(s)  Provider:   [email or web address], MD       Other Instructions Tonny Bollman- Long Term Monitor Instructions  Your physician has requested you wear a ZIO patch monitor for 14 days.  This is a single patch monitor. [employer] supplies one patch monitor per enrollment. Additional stickers are not available. Please do not apply patch if you will be having a Nuclear Stress Test,  Echocardiogram, Cardiac CT, MRI, or Chest Xray during the period you would be wearing the  monitor. The patch cannot be worn during these tests. You cannot remove and re-apply the  ZIO XT patch monitor.  Your ZIO patch monitor will be mailed 3 day USPS to your address on file. It may take 3-5 days  to receive your monitor after you have been enrolled.  Once you have received your monitor, please review the enclosed instructions. Your monitor  has already been registered assigning a specific monitor serial # to you.  Billing and Patient Assistance Program Information  We have supplied [employer] with any of your insurance information on file for billing purposes. [employer] offers a sliding scale Patient Assistance  Program for patients that do not have  insurance, or whose insurance does not completely cover the cost of the ZIO monitor.  You must apply for the Patient Assistance Program to qualify for this discounted rate.  To apply, please call [employer] at  [phone], select option 4, select option 2, ask to apply for  Patient Assistance Program. Theodore Demark will ask your household income, and how many people  are in your household. They will quote your out-of-pocket cost based on that information.  [employer] will also be able to set up a 39-month, interest-free payment plan if needed.  Applying the monitor   Shave hair from upper left chest.  Hold abrader disc by orange tab. Rub abrader in 40 strokes over the upper left chest as  indicated in your monitor instructions.  Clean area with 4 enclosed alcohol pads. Let dry.  Apply patch as indicated in monitor instructions. Patch will be placed under collarbone on left  side of chest with arrow pointing upward.  Rub patch adhesive wings for 2 minutes. Remove white label marked "1". Remove the white  label marked "2". Rub patch adhesive wings for 2 additional minutes.  While looking in a mirror, press and release button in center of patch. A small green light will  flash 3-4 times. This will be your only indicator that the monitor has been turned on.  Do not shower for the first 24 hours. You may shower after the first 24 hours.  Press the button if you feel a symptom. You will hear a small click. Record Date, Time and  Symptom in the Patient Logbook.  When you are ready to remove the patch, follow instructions on the last 2 pages of Patient  Logbook. Stick patch monitor onto the last page of Patient Logbook.  Place Patient Logbook in the blue and white box. Use locking tab on box and tape box closed  securely. The blue and white box has prepaid postage on it. Please place it in the mailbox as  soon as possible. Your physician should have your test results approximately 7 days after the  monitor has been mailed back to [facility].  Call [place] at [phone] if you have questions regarding  your ZIO XT patch monitor. Call them immediately if you see an orange light  blinking on your  monitor.  If your monitor falls off in less than 4 days, contact our Monitor department at [phone].  If your monitor becomes loose or falls off after 4 days call [employer] at [phone] for  suggestions on securing your monitor    Signed, Sherren Mocha, MD  08/19/2022 11:59 AM    [place]

## 2022-08-18 NOTE — Progress Notes (Unsigned)
Enrolled for [employer] to mail a ZIO XT long term holter monitor to the patients address on file.  

## 2022-08-20 DIAGNOSIS — I10 Essential (primary) hypertension: Secondary | ICD-10-CM

## 2022-08-20 DIAGNOSIS — E782 Mixed hyperlipidemia: Secondary | ICD-10-CM

## 2022-08-20 DIAGNOSIS — I2510 Atherosclerotic heart disease of native coronary artery without angina pectoris: Secondary | ICD-10-CM | POA: Diagnosis not present

## 2022-08-20 DIAGNOSIS — R011 Cardiac murmur, unspecified: Secondary | ICD-10-CM

## 2022-08-20 DIAGNOSIS — R002 Palpitations: Secondary | ICD-10-CM | POA: Diagnosis not present

## 2022-09-07 ENCOUNTER — Ambulatory Visit (HOSPITAL_COMMUNITY): Payer: Medicare Other | Attending: Cardiovascular Disease

## 2022-09-07 DIAGNOSIS — I10 Essential (primary) hypertension: Secondary | ICD-10-CM | POA: Insufficient documentation

## 2022-09-07 DIAGNOSIS — R011 Cardiac murmur, unspecified: Secondary | ICD-10-CM | POA: Diagnosis not present

## 2022-09-07 DIAGNOSIS — R002 Palpitations: Secondary | ICD-10-CM | POA: Diagnosis present

## 2022-09-07 DIAGNOSIS — I2510 Atherosclerotic heart disease of native coronary artery without angina pectoris: Secondary | ICD-10-CM | POA: Insufficient documentation

## 2022-09-07 DIAGNOSIS — E782 Mixed hyperlipidemia: Secondary | ICD-10-CM | POA: Diagnosis present

## 2022-09-07 LAB — ECHOCARDIOGRAM COMPLETE
Area-P 1/2: 2.2 cm2
S' Lateral: 2.6 cm

## 2022-09-16 DIAGNOSIS — I2510 Atherosclerotic heart disease of native coronary artery without angina pectoris: Secondary | ICD-10-CM | POA: Diagnosis not present

## 2022-09-16 DIAGNOSIS — R002 Palpitations: Secondary | ICD-10-CM | POA: Diagnosis not present

## 2022-09-25 ENCOUNTER — Telehealth: Payer: Self-pay | Admitting: Cardiovascular Disease

## 2022-09-25 DIAGNOSIS — I471 Supraventricular tachycardia, unspecified: Secondary | ICD-10-CM

## 2022-09-25 NOTE — Telephone Encounter (Signed)
-----   Message from Sherren Mocha, MD sent at 09/22/2022 11:33 AM EDT ----- With episodes of syncope/near-syncope and heart palpitations, please refer to EP for evaluation. Pt already treated with a beta blocker. thanks

## 2022-09-25 NOTE — Telephone Encounter (Signed)
Spoke with patient about heart monitor. Questions answered and referral placed to EP.

## 2022-09-25 NOTE — Telephone Encounter (Signed)
Patient is returning call in regards to results and is requesting a return call.

## 2022-09-25 NOTE — Telephone Encounter (Signed)
Spoke with patient about results of monitor and ECHO. Referral placed to EP. All questions answered.

## 2022-10-12 ENCOUNTER — Encounter: Payer: Self-pay | Admitting: Cardiovascular Disease

## 2022-10-12 ENCOUNTER — Ambulatory Visit: Payer: Medicare Other | Attending: Cardiovascular Disease | Admitting: Cardiovascular Disease

## 2022-10-12 VITALS — BP 130/80 | HR 60 | Ht 66.5 in | Wt 191.0 lb

## 2022-10-12 DIAGNOSIS — I471 Supraventricular tachycardia, unspecified: Secondary | ICD-10-CM

## 2022-10-12 DIAGNOSIS — R55 Syncope and collapse: Secondary | ICD-10-CM

## 2022-10-12 NOTE — Progress Notes (Signed)
Electrophysiology Office Note:    Date:  10/12/2022   ID:  Felicia Silva, Felicia Silva February 17, 1956, MRN [id number]  PCP:  Celene Squibb, [place] [facility] Cardiologist:  Sherren Mocha, MD Electrophysiologist:  Melida Quitter, MD     Referring MD: Sherren Mocha, MD   History of Present Illness:    Felicia Silva is a 67 y.o. female with a hx listed below, significant for coronary disease, referred for arrhythmia management.  She has a history of left circumflex PCI due to STEMI in 2018.  She mentioned a few episodes of dizzy spells when she saw Dr. Burt Knack on August 18, 2022.  1 event occurred after she drank a bottle of magnesium citrate to treat constipation.  Different event occurred in 2020 after she took Tylenol with codeine.  2 events have occurred shortly after she got out of the hot tub.  Her husband performed CPR briefly at that time because she was completely unresponsive and not breathing.  However she quickly regained consciousness.  He notes that the water is so hot that he cannot tolerate to get in it at all.  She is typically stays in for about 20 to 30 minutes.    A 14-day Zio patch was placed that showed predominantly sinus rhythm with a mean heart rate of 67 bpm and several episodes of SVT, the longest of which was about 6 minutes.  Rates are highly variable, between 121 up to 226 bpm.  Strips appear to show a long RP tachycardia.  Symptom events were associated with isolated ectopy.b   Past Medical History:  Diagnosis Date   CAD (coronary artery disease), native coronary artery    11/28/16 STEMI PCI DES--LCx, EF normal.    Complication of anesthesia    Diabetes mellitus without complication    Hyperlipidemia associated with type 2 diabetes mellitus ([place]), goal LDL < 70 11/30/2016   Hypertension    Multiple thyroid nodules    on CT of chest   PONV (postoperative nausea and vomiting)    STEMI (ST elevation myocardial infarction) 11/2016   DES CFX     Past Surgical History:  Procedure Laterality Date   ABDOMINAL SURGERY     CHOLECYSTECTOMY     CORONARY STENT INTERVENTION N/A 11/28/2016   Procedure: Coronary Stent Intervention;  Surgeon: Sherren Mocha, MD;  Location: [facility];  Service: Cardiovascular;  Laterality: N/A;   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   Procedure: LAPAROSCOPIC ROUX-EN-Y GASTRIC BYPASS WITH UPPER ENDOSCOPY AND ERAS PATHWAY;  Surgeon: Excell Seltzer, MD;  Location: [facility];  Service: General;  Laterality: N/A;   HERNIA REPAIR     LEFT HEART CATH AND CORONARY ANGIOGRAPHY N/A 11/28/2016   Procedure: Left Heart Cath and Coronary Angiography;  Surgeon: Sherren Mocha, MD;  Location: [facility];  Service: Cardiovascular;  Laterality: N/A;   LEFT HEART CATH AND CORONARY ANGIOGRAPHY N/A 08/26/2018   Procedure: LEFT HEART CATH AND CORONARY ANGIOGRAPHY;  Surgeon: Burnell Blanks, MD;  Location: [facility];  Service: Cardiovascular;  Laterality: N/A;   TONSILLECTOMY      Current Medications: Current Meds  Medication Sig   amLODipine (NORVASC) 5 MG tablet Take 5 mg by mouth at bedtime.   aspirin 81 MG chewable tablet Chew 81 mg by mouth daily.   calcium carbonate (TUMS - DOSED IN MG ELEMENTAL CALCIUM) 500 MG chewable tablet Chew 1 tablet by mouth daily.   EPINEPHrine 0.3 mg/0.3 mL IJ SOAJ  injection INJECT 1 PEN INTO THE MUSCLE FOR ANAPHYLAXIS   metoprolol tartrate (LOPRESSOR) 25 MG tablet Take 1 tablet (25 mg total) by mouth 2 (two) times daily.   Multiple Vitamin (MULTIVITAMIN WITH MINERALS) TABS tablet Take 1 tablet by mouth daily. ODT   nitroGLYCERIN (NITROSTAT) 0.4 MG SL tablet Place 1 tablet (0.4 mg total) under the tongue every 5 (five) minutes x 3 doses as needed for chest pain.   protein supplement shake (PREMIER PROTEIN) LIQD Take 59.1 mLs (2 oz total) by mouth every 2 (two) hours.   rosuvastatin (CRESTOR) 10 MG tablet Take 1 tablet (10 mg total) by mouth daily.     Allergies:    Shellfish allergy and Blood-group specific substance   Social and Family History: Reviewed in Epic  ROS:   Please see the history of present illness.    All other systems reviewed and are negative.  EKGs/Labs/Other Studies Reviewed Today:    Echocardiogram:  TTE 09/07/22 EF 65% moderately dilated left atrium.   Monitors:  13-day Zio patch sinus rhythm heart rate 42 to 226 bpm.  Predominantly sinus rhythm with a average heart rate of 67 bpm.  There were 109 SVT runs, the longest was about 6 minutes with an average rate of 121 bpm.  The second longest run was 3 minutes 38 seconds.  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  Stress testing:    Advanced imaging:    EKG:  Last EKG results: Today -- sinus rhythm 60 bpm, rightward axis   Recent Labs: No results found for requested labs within last 365 days.     Physical Exam:    VS:  BP 130/80   Pulse 60   Ht 5' 6.5" (1.689 m)   Wt 191 lb (86.6 kg)   SpO2 (!) 60%   BMI 30.37 kg/m     Wt Readings from Last 3 Encounters:  10/12/22 191 lb (86.6 kg)  08/18/22 190 lb 9.6 oz (86.5 kg)  10/25/18 205 lb 8 oz (93.2 kg)     GEN: Well nourished, well developed in no acute distress CARDIAC: RRR, no murmurs, rubs, gallops RESPIRATORY:  Normal work of breathing MUSCULOSKELETAL: no edema    ASSESSMENT & PLAN:    Recurrent syncope Sounds like a, triggering orthostasis after being in a extremely hot hot tub for prolonged period time and possibly neurocardiogenic cause.  All episodes appear to be triggered.  SVT These forms were relatively brief.  I suspected to longer episode detected on monitor were brought on to start extent by anxiety from public speaking. Given the rate variability and long RP, I suspect atrial tachycardia is most likely. I recommended continued observation and monitoring.  We briefly discussed loop monitor, but she would rather keep a log of her palpitations and no the  duration when episodes occur.            Medication Adjustments/Labs and Tests Ordered: Current medicines are reviewed at length with the patient today.  Concerns regarding medicines are outlined above.  No orders of the defined types were placed in this encounter.  No orders of the defined types were placed in this encounter.    Signed, Melida Quitter, MD  10/12/2022 2:28 PM    [place]

## 2022-10-12 NOTE — Patient Instructions (Signed)
Medication Instructions:  Your physician recommends that you continue on your current medications as directed. Please refer to the Current Medication list given to you today. *If you need a refill on your cardiac medications before your next appointment, please call your pharmacy*   Follow-Up: At [facility], you and your health needs are our priority.  As part of our continuing mission to provide you with exceptional heart care, we have created designated Provider Care Teams.  These Care Teams include your primary Cardiologist (physician) and Advanced Practice Providers (APPs -  Physician Assistants and Nurse Practitioners) who all work together to provide you with the care you need, when you need it.  We recommend signing up for the patient portal called "MyChart".  Sign up information is provided on this After Visit Summary.  MyChart is used to connect with patients for Virtual Visits (Telemedicine).  Patients are able to view lab/test results, encounter notes, upcoming appointments, etc.  Non-urgent messages can be sent to your provider as well.   To learn more about what you can do with MyChart, go to [email or web address].    Your next appointment:   1 year(s)  Provider:   Doralee Albino, MD

## 2022-11-16 ENCOUNTER — Ambulatory Visit: Payer: Medicare Other | Attending: Cardiovascular Disease

## 2022-11-16 DIAGNOSIS — E782 Mixed hyperlipidemia: Secondary | ICD-10-CM

## 2022-11-16 DIAGNOSIS — R002 Palpitations: Secondary | ICD-10-CM

## 2022-11-16 DIAGNOSIS — R011 Cardiac murmur, unspecified: Secondary | ICD-10-CM

## 2022-11-16 DIAGNOSIS — I10 Essential (primary) hypertension: Secondary | ICD-10-CM

## 2022-11-16 DIAGNOSIS — I2510 Atherosclerotic heart disease of native coronary artery without angina pectoris: Secondary | ICD-10-CM

## 2022-11-17 LAB — LIPID PANEL
Chol/HDL Ratio: 1.9 ratio (ref 0.0–4.4)
Cholesterol, Total: 120 mg/dL (ref 100–199)
HDL: 62 mg/dL (ref >39)
LDL Chol Calc (NIH): 45 mg/dL (ref 0–99)
Triglycerides: 56 mg/dL (ref 0–149)
VLDL Cholesterol Cal: 13 mg/dL (ref 5–40)

## 2022-11-17 LAB — HEPATIC FUNCTION PANEL
ALT: 31 IU/L (ref 0–32)
AST: 34 IU/L (ref 0–40)
Albumin: 4.4 g/dL (ref 3.9–4.9)
Alkaline Phosphatase: 141 IU/L — ABNORMAL HIGH (ref 44–121)
Bilirubin Total: 0.8 mg/dL (ref 0.0–1.2)
Bilirubin, Direct: 0.25 mg/dL (ref 0.00–0.40)
Total Protein: 6.3 g/dL (ref 6.0–8.5)

## 2022-12-30 DIAGNOSIS — I10 Essential (primary) hypertension: Secondary | ICD-10-CM | POA: Diagnosis not present

## 2023-01-05 DIAGNOSIS — E119 Type 2 diabetes mellitus without complications: Secondary | ICD-10-CM | POA: Diagnosis not present

## 2023-01-05 DIAGNOSIS — I129 Hypertensive chronic kidney disease with stage 1 through stage 4 chronic kidney disease, or unspecified chronic kidney disease: Secondary | ICD-10-CM | POA: Diagnosis not present

## 2023-01-05 DIAGNOSIS — Z0000 Encounter for general adult medical examination without abnormal findings: Secondary | ICD-10-CM | POA: Diagnosis not present

## 2023-01-05 DIAGNOSIS — I10 Essential (primary) hypertension: Secondary | ICD-10-CM | POA: Diagnosis not present

## 2023-01-05 DIAGNOSIS — E782 Mixed hyperlipidemia: Secondary | ICD-10-CM | POA: Diagnosis not present

## 2023-01-05 DIAGNOSIS — N183 Chronic kidney disease, stage 3 unspecified: Secondary | ICD-10-CM | POA: Diagnosis not present

## 2023-01-05 DIAGNOSIS — E1122 Type 2 diabetes mellitus with diabetic chronic kidney disease: Secondary | ICD-10-CM | POA: Diagnosis not present

## 2023-01-05 DIAGNOSIS — R011 Cardiac murmur, unspecified: Secondary | ICD-10-CM | POA: Diagnosis not present

## 2023-01-05 DIAGNOSIS — I252 Old myocardial infarction: Secondary | ICD-10-CM | POA: Diagnosis not present

## 2023-10-01 DIAGNOSIS — I10 Essential (primary) hypertension: Secondary | ICD-10-CM | POA: Diagnosis not present

## 2023-10-02 LAB — LAB REPORT - SCANNED
A1c: 7.8
EGFR: 41

## 2023-10-08 DIAGNOSIS — R945 Abnormal results of liver function studies: Secondary | ICD-10-CM | POA: Diagnosis not present

## 2023-10-08 DIAGNOSIS — N183 Chronic kidney disease, stage 3 unspecified: Secondary | ICD-10-CM | POA: Diagnosis not present

## 2023-10-08 DIAGNOSIS — E782 Mixed hyperlipidemia: Secondary | ICD-10-CM | POA: Diagnosis not present

## 2023-10-08 DIAGNOSIS — I252 Old myocardial infarction: Secondary | ICD-10-CM | POA: Diagnosis not present

## 2023-10-08 DIAGNOSIS — M810 Age-related osteoporosis without current pathological fracture: Secondary | ICD-10-CM | POA: Diagnosis not present

## 2023-10-08 DIAGNOSIS — I129 Hypertensive chronic kidney disease with stage 1 through stage 4 chronic kidney disease, or unspecified chronic kidney disease: Secondary | ICD-10-CM | POA: Diagnosis not present

## 2023-10-08 DIAGNOSIS — R011 Cardiac murmur, unspecified: Secondary | ICD-10-CM | POA: Diagnosis not present

## 2023-10-08 DIAGNOSIS — I2510 Atherosclerotic heart disease of native coronary artery without angina pectoris: Secondary | ICD-10-CM | POA: Diagnosis not present

## 2023-10-08 DIAGNOSIS — I10 Essential (primary) hypertension: Secondary | ICD-10-CM | POA: Diagnosis not present

## 2023-10-08 DIAGNOSIS — E1169 Type 2 diabetes mellitus with other specified complication: Secondary | ICD-10-CM | POA: Diagnosis not present

## 2023-10-08 DIAGNOSIS — E1122 Type 2 diabetes mellitus with diabetic chronic kidney disease: Secondary | ICD-10-CM | POA: Diagnosis not present

## 2023-10-08 DIAGNOSIS — E1151 Type 2 diabetes mellitus with diabetic peripheral angiopathy without gangrene: Secondary | ICD-10-CM | POA: Diagnosis not present

## 2023-10-29 ENCOUNTER — Telehealth: Payer: Self-pay

## 2023-10-29 NOTE — Progress Notes (Signed)
   10/29/2023  Patient ID: Felicia Silva, female   DOB: [birth year], 68 y.o.   MRN: [id number]   Patient appeared on insurance report for not passing the quality metrics in 2024:  Medication Adherence for Cholesterol (MAC)   Outreach to the patient was not needed today.  Meds Tracking:  -Rosuvastatin  10 mg - Last fill 90DS on 08/09/23, LDL 41 on 10/01/23. Does not qualify for metric yet this year, next fill due 11/07/23.  -Metformin ER 500 mg - Last fill 90DS on 10/08/23, A1C 7.8 on 10/01/23. Does not qualify for metric yet this year, next fill due 01/06/24.  Plan:  Was not taking metformin last year, will start tracking this year now that she has restarted. Cholesterol well controlled, next fill history review on 11/10/23.   Ozell Lefevre, PharmD

## 2023-11-05 DIAGNOSIS — E1169 Type 2 diabetes mellitus with other specified complication: Secondary | ICD-10-CM | POA: Diagnosis not present

## 2023-11-05 DIAGNOSIS — I129 Hypertensive chronic kidney disease with stage 1 through stage 4 chronic kidney disease, or unspecified chronic kidney disease: Secondary | ICD-10-CM | POA: Diagnosis not present

## 2023-11-05 DIAGNOSIS — N183 Chronic kidney disease, stage 3 unspecified: Secondary | ICD-10-CM | POA: Diagnosis not present

## 2023-11-05 DIAGNOSIS — I10 Essential (primary) hypertension: Secondary | ICD-10-CM | POA: Diagnosis not present

## 2023-11-08 ENCOUNTER — Ambulatory Visit: Payer: Medicare Other | Attending: Cardiovascular Disease | Admitting: Cardiovascular Disease

## 2023-11-08 ENCOUNTER — Encounter: Payer: Self-pay | Admitting: Cardiovascular Disease

## 2023-11-08 VITALS — BP 140/84 | HR 60 | Ht 66.5 in | Wt 193.4 lb

## 2023-11-08 DIAGNOSIS — R011 Cardiac murmur, unspecified: Secondary | ICD-10-CM | POA: Diagnosis not present

## 2023-11-08 DIAGNOSIS — I2510 Atherosclerotic heart disease of native coronary artery without angina pectoris: Secondary | ICD-10-CM

## 2023-11-08 DIAGNOSIS — R002 Palpitations: Secondary | ICD-10-CM

## 2023-11-08 DIAGNOSIS — I471 Supraventricular tachycardia, unspecified: Secondary | ICD-10-CM

## 2023-11-08 DIAGNOSIS — E782 Mixed hyperlipidemia: Secondary | ICD-10-CM | POA: Diagnosis not present

## 2023-11-08 DIAGNOSIS — I10 Essential (primary) hypertension: Secondary | ICD-10-CM | POA: Diagnosis not present

## 2023-11-08 MED ORDER — ROSUVASTATIN CALCIUM 10 MG PO TABS
10.0000 mg | ORAL_TABLET | Freq: Every day | ORAL | 3 refills | Status: DC
Start: 2023-11-08 — End: 2023-12-03

## 2023-11-08 NOTE — Patient Instructions (Signed)
 Follow-Up: At [facility], you and your health needs are our priority.  As part of our continuing mission to provide you with exceptional heart care, our providers are all part of one team.  This team includes your primary Cardiologist (physician) and Advanced Practice Providers or APPs (Physician Assistants and Nurse Practitioners) who all work together to provide you with the care you need, when you need it.  Your next appointment:   1 year(s)  Provider:   Arnoldo Lapping, MD

## 2023-11-08 NOTE — Assessment & Plan Note (Signed)
 No anginal symptoms reported.  She continues on aspirin  for antiplatelet therapy, metoprolol , and rosuvastatin  with an LDL cholesterol less than 55 mg/dL.  No changes are made today.  I will see her back in 1 year.

## 2023-11-08 NOTE — Progress Notes (Signed)
 Cardiology Office Note:    Date:  11/08/2023   ID:  Makynley, Raynolds [birth year], MRN [id number]  PCP:  Omie Bickers, MD   [facility] Providers Cardiologist:  Arnoldo Lapping, MD Electrophysiologist:  Efraim Grange, MD     Referring MD: Omie Bickers, MD   Chief Complaint  Patient presents with   Coronary Artery Disease    History of Present Illness:    Felicia Silva is a 68 y.o. female with a hx of coronary artery disease, presenting for follow-up evaluation. The patient initially presented in 2018 with a STEMI involving the left circumflex. She was treated with primary PCI using drug-eluting stent and had an uncomplicated initial hospital course. She was noted to have moderate nonobstructive stenosis in the LAD with recommendations for medical therapy. LVEF at the time of her MI is 55 to 60%. She underwent repeat catheterization in 2020 for concerns over unstable angina and was found to have stable coronary anatomy with continued patency of the circumflex stent and no change in moderate nonobstructive LAD stenosis.  In 2024 she was found to have SVT on a ZIO monitor that was done for palpitations.  Her longest episode was about 6 minutes and she was referred for formal EP evaluation.  She is felt to most likely have atrial tachycardia and ongoing surveillance was recommended.  She was also noted to have a heart murmur and an echocardiogram demonstrated normal LV and RV function, mild MR, and aortic sclerosis without stenosis.  She is here with her husband today. She recently saw her PCP and her HgB A1C was modestly increased to 7.8 so she was started on metformin. Her weight is up about 3 pounds over one year and she is motivated to get her weight back off.  From a cardiac perspective, she is doing well.  She denies any chest pain, chest pressure, or shortness of breath.  She has had no recent heart palpitations.  She has no complaints today and is motivated to work on her  lifestyle modification.  Current Medications: Current Meds  Medication Sig   amLODipine  (NORVASC ) 5 MG tablet Take 5 mg by mouth at bedtime.   aspirin  81 MG chewable tablet Chew 81 mg by mouth daily.   calcium  carbonate (TUMS - DOSED IN MG ELEMENTAL CALCIUM ) 500 MG chewable tablet Chew 1 tablet by mouth daily.   EPINEPHrine  0.3 mg/0.3 mL IJ SOAJ injection INJECT 1 PEN INTO THE MUSCLE FOR ANAPHYLAXIS   metoprolol  tartrate (LOPRESSOR ) 25 MG tablet Take 1 tablet (25 mg total) by mouth 2 (two) times daily.   Multiple Vitamin (MULTIVITAMIN WITH MINERALS) TABS tablet Take 1 tablet by mouth daily. ODT   nitroGLYCERIN  (NITROSTAT ) 0.4 MG SL tablet Place 1 tablet (0.4 mg total) under the tongue every 5 (five) minutes x 3 doses as needed for chest pain.   protein supplement shake (PREMIER PROTEIN) LIQD Take 59.1 mLs (2 oz total) by mouth every 2 (two) hours.   [DISCONTINUED] rosuvastatin  (CRESTOR ) 10 MG tablet Take 1 tablet (10 mg total) by mouth daily.     Allergies:   Shellfish allergy and Blood-group specific substance   ROS:   Please see the history of present illness.    All other systems reviewed and are negative.  EKGs/Labs/Other Studies Reviewed:    The following studies were reviewed today: Cardiac Studies & Procedures   ______________________________________________________________________________________________ CARDIAC CATHETERIZATION  CARDIAC CATHETERIZATION 08/26/2018  Conclusion  Previously placed Mid Cx drug eluting stent  is widely patent.  Mid RCA lesion is 20% stenosed.  Dist RCA lesion is 30% stenosed.  Prox LAD lesion is 50% stenosed.  The left ventricular systolic function is normal.  LV end diastolic pressure is normal.  The left ventricular ejection fraction is 55-65% by visual estimate.  There is no mitral valve regurgitation.  1. Patent stent mid to distal Circumflex artery without restenosis 2. Moderate mid LAD stenosis. This is unchanged from her last  cath and does not appear to be flow limiting. 3. Mild non-obstructive disease in the mid and distal RCA 4. Preserved LV systolic function  Recommendations: Continue medical management of CAD  Findings Coronary Findings Diagnostic  Dominance: Right  Left Anterior Descending Vessel is large. Prox LAD lesion is 50% stenosed.  Left Circumflex Vessel is large. Previously placed Mid Cx drug eluting stent is widely patent.  Right Coronary Artery Vessel is large. Vessel is angiographically normal. Mid RCA lesion is 20% stenosed. Dist RCA lesion is 30% stenosed.  Intervention  No interventions have been documented.   CARDIAC CATHETERIZATION  CARDIAC CATHETERIZATION 11/28/2016  Conclusion 1. Acute MI involving the left circumflex, treated successfully with primary PCI using a 3.0 x 16 mm Promus DES 2. Moderate nonobstructive stenosis of the mid LAD 3. Widely patent dominant RCA with minimal irregularities 4. Mild segmental contraction abnormality the left ventricle with preserved overall LV systolic function  Recommendations: Dual antiplatelet therapy with aspirin  and brilinta  12 months as tolerated. If no immediate complications arise, anticipate hospital discharge Monday morning (48 hours). Initiate post MI medical therapy.  Findings Coronary Findings Diagnostic  Dominance: Right  Left Anterior Descending  Left Circumflex The lesion is thrombotic.  Right Coronary Artery The vessel exhibits minimal luminal irregularities.  Intervention  Mid Cx lesion Angioplasty Lesion crossed with guidewire using a WIRE COUGAR XT ST. 190CM. Pre-stent angioplasty was performed using a BALLOON EMERGE MR 2.5X12. A STENT PROMUS PREM MR 3.0X16 drug eluting stent was successfully placed. Post-stent angioplasty was performed using a BALLOON Butler MOZEC 3.0X13. Maximum pressure: 14 atm. There is no pre-interventional antegrade distal flow (TIMI 0).  The post-interventional distal flow is normal  (TIMI 3). The intervention was successful . No complications occurred at this lesion. An XB 3.0 cm guide is used. Heparin  is administered for anticoagulation and a therapeutic ACT is achieved (263). Aggrastat  is given via a single bolus. Brilinta  180 mg is administered on the table. The mid circumflex is occluded acutely. The lesion is crossed with a cougar wire without difficulty. The lesion is predilated with a 2.5 mm balloon, stented with a 3.0 x 16 mm Promus DES, and postdilated with a 3.0 noncompliant balloon to 14 atm. The PCI procedure is well-tolerated with no immediate complications. There is TIMI 0 flow initially, improved to TIMI 3 at the completion of the procedure. There is 100% occlusion initially, improved to 0% post procedure. There is a 0% residual stenosis post intervention.     ECHOCARDIOGRAM  ECHOCARDIOGRAM COMPLETE 09/07/2022  Narrative ECHOCARDIOGRAM REPORT    Patient Name:   Felicia Silva Date of Exam: 09/07/2022 Medical Rec #:  [id number]       Height:       66.5 in Accession #:    [id number]      Weight:       190.6 lb Date of Birth:  [birth year]      BSA:          1.970 m Patient Age:    21 years  BP:           144/94 mmHg Patient Gender: F               HR:           55 bpm. Exam Location:  [address]  Procedure: 2D Echo, Color Doppler and Cardiac Doppler  Indications:    Murmur R01.1  History:        Patient has no prior history of Echocardiogram examinations. CAD and Previous Myocardial Infarction; Risk Factors:Diabetes, Hypertension and Dyslipidemia.  Sonographer:    Joleen Navy RDCS Referring Phys: [phone] Kyrese Gartman  IMPRESSIONS   1. Left ventricular ejection fraction, by estimation, is 60 to 65%. The left ventricle has normal function. The left ventricle has no regional wall motion abnormalities. There is mild concentric left ventricular hypertrophy with moderate focal hypertrophy of the basilar septum. Left ventricular diastolic  parameters are consistent with Grade I diastolic dysfunction (impaired relaxation). 2. Right ventricular systolic function is normal. The right ventricular size is normal. There is normal pulmonary artery systolic pressure. 3. Left atrial size was moderately dilated. 4. Right atrial size was mildly dilated. 5. The mitral valve is normal in structure. Mild mitral valve regurgitation. No evidence of mitral stenosis. Moderate mitral annular calcification. 6. The aortic valve is tricuspid. There is mild calcification of the aortic valve. Aortic valve regurgitation is trivial. Aortic valve sclerosis/calcification is present, without any evidence of aortic stenosis. 7. Aortic dilatation noted. There is mild dilatation of the ascending aorta, measuring 41 mm. 8. The inferior vena cava is normal in size with greater than 50% respiratory variability, suggesting right atrial pressure of 3 mmHg.  FINDINGS Left Ventricle: Left ventricular ejection fraction, by estimation, is 60 to 65%. The left ventricle has normal function. The left ventricle has no regional wall motion abnormalities. The left ventricular internal cavity size was normal in size. There is mild concentric left ventricular hypertrophy. Left ventricular diastolic parameters are consistent with Grade I diastolic dysfunction (impaired relaxation).  Right Ventricle: The right ventricular size is normal. No increase in right ventricular wall thickness. Right ventricular systolic function is normal. There is normal pulmonary artery systolic pressure. The tricuspid regurgitant velocity is 1.73 m/s, and with an assumed right atrial pressure of 3 mmHg, the estimated right ventricular systolic pressure is 15.0 mmHg.  Left Atrium: Left atrial size was moderately dilated.  Right Atrium: Right atrial size was mildly dilated.  Pericardium: There is no evidence of pericardial effusion.  Mitral Valve: The mitral valve is normal in structure. Moderate mitral  annular calcification. Mild mitral valve regurgitation. No evidence of mitral valve stenosis.  Tricuspid Valve: The tricuspid valve is normal in structure. Tricuspid valve regurgitation is trivial. No evidence of tricuspid stenosis.  Aortic Valve: The aortic valve is tricuspid. There is mild calcification of the aortic valve. Aortic valve regurgitation is trivial. Aortic valve sclerosis/calcification is present, without any evidence of aortic stenosis.  Pulmonic Valve: The pulmonic valve was not well visualized. Pulmonic valve regurgitation is mild. No evidence of pulmonic stenosis.  Aorta: Aortic dilatation noted. There is mild dilatation of the ascending aorta, measuring 41 mm.  Venous: The inferior vena cava is normal in size with greater than 50% respiratory variability, suggesting right atrial pressure of 3 mmHg.  IAS/Shunts: No atrial level shunt detected by color flow Doppler.   LEFT VENTRICLE PLAX 2D LVIDd:         3.90 cm   Diastology LVIDs:  2.60 cm   LV e' medial:    4.21 cm/s LV PW:         1.20 cm   LV E/e' medial:  19.1 LV IVS:        1.40 cm   LV e' lateral:   6.75 cm/s LVOT diam:     2.50 cm   LV E/e' lateral: 11.9 LV SV:         120 LV SV Index:   61 LVOT Area:     4.91 cm  3D Volume EF: 3D EF:        62 % LV EDV:       119 ml LV ESV:       45 ml LV SV:        74 ml  RIGHT VENTRICLE RV Basal diam:  3.40 cm RV Mid diam:    3.10 cm RV S prime:     9.55 cm/s TAPSE (M-mode): 1.6 cm  LEFT ATRIUM             Index        RIGHT ATRIUM           Index LA diam:        4.70 cm 2.39 cm/m   RA Area:     15.90 cm LA Vol (A2C):   50.3 ml 25.53 ml/m  RA Volume:   38.00 ml  19.29 ml/m LA Vol (A4C):   64.3 ml 32.63 ml/m LA Biplane Vol: 57.6 ml 29.23 ml/m AORTIC VALVE LVOT Vmax:   94.50 cm/s LVOT Vmean:  63.200 cm/s LVOT VTI:    0.244 m  AORTA Ao Root diam: 3.20 cm Ao Asc diam:  4.10 cm  MITRAL VALVE                TRICUSPID VALVE MV Area (PHT): 2.20  cm     TR Peak grad:   12.0 mmHg MV Decel Time: 345 msec     TR Vmax:        173.00 cm/s MV E velocity: 80.40 cm/s MV A velocity: 120.00 cm/s  SHUNTS MV E/A ratio:  0.67         Systemic VTI:  0.24 m Systemic Diam: 2.50 cm  Jules Oar MD Electronically signed by Jules Oar MD Signature Date/Time: 09/07/2022/5:10:27 PM    Final    MONITORS  LONG TERM MONITOR (3-14 DAYS) 09/16/2022  Narrative Patch Wear Time:  13 days and 23 hours (2024-02-08T16:05:16-0500 to 2024-02-22T15:07:17-0500)  Patient had a min HR of 42 bpm, max HR of 226 bpm, and avg HR of 67 bpm. Predominant underlying rhythm was Sinus Rhythm. 109 Supraventricular Tachycardia runs occurred, the run with the fastest interval lasting 3 mins 38 secs with a max rate of 226 bpm, the longest lasting 5 mins 57 secs with an avg rate of 121 bpm. Isolated SVEs were occasional (1.3%, 17601), SVE Couplets were rare (<1.0%, 356), and SVE Triplets were rare (<1.0%, 132). Isolated VEs were rare (<1.0%), VE Couplets were rare (<1.0%), and no VE Triplets were present. Ventricular Bigeminy was present.  Conclusion: The basic rhythm is normal sinus with an average HR of 67 bpm. There are occasional runs of SVT with heart rates in the range of 160 bpm but range from 121-226 bpm, runs lasting as long as 6 minutes.  Rare ventricular ectopy. No bradycardic events.       ______________________________________________________________________________________________      EKG:   EKG Interpretation Date/Time:  Monday November 08 2023 09:34:34 EDT Ventricular Rate:  60 PR Interval:  240 QRS Duration:  88 QT Interval:  424 QTC Calculation: 424 R Axis:   96  Text Interpretation: Sinus rhythm with 1st degree A-V block Rightward axis When compared with ECG of 27-Aug-2018 03:21, Questionable change in QRS axis Confirmed by Arnoldo Lapping [id number]) on 11/08/2023 9:58:17 AM    Recent Labs: 11/16/2022: ALT 31  Recent Lipid Panel     Component Value Date/Time   CHOL 120 11/16/2022 1227   TRIG 56 11/16/2022 1227   HDL 62 11/16/2022 1227   CHOLHDL 1.9 11/16/2022 1227   CHOLHDL 2.5 08/26/2018 1141   VLDL 9 08/26/2018 1141   LDLCALC 45 11/16/2022 1227          Physical Exam:    VS:  BP (!) 140/84   Pulse 60   Ht 5' 6.5" (1.689 m)   Wt 193 lb 6.4 oz (87.7 kg)   SpO2 97%   BMI 30.75 kg/m     Wt Readings from Last 3 Encounters:  11/08/23 193 lb 6.4 oz (87.7 kg)  10/12/22 191 lb (86.6 kg)  08/18/22 190 lb 9.6 oz (86.5 kg)     GEN:  Well nourished, well developed in no acute distress HEENT: Normal NECK: No JVD; No carotid bruits LYMPHATICS: No lymphadenopathy CARDIAC: RRR, 2/6 early systolic ejection murmur at the right upper sternal border RESPIRATORY:  Clear to auscultation without rales, wheezing or rhonchi  ABDOMEN: Soft, non-tender, non-distended MUSCULOSKELETAL:  No edema; No deformity  SKIN: Warm and dry NEUROLOGIC:  Alert and oriented x 3 PSYCHIATRIC:  Normal affect   Assessment & Plan Coronary artery disease involving native coronary artery of native heart without angina pectoris No anginal symptoms reported.  She continues on aspirin  for antiplatelet therapy, metoprolol , and rosuvastatin  with an LDL cholesterol less than 55 mg/dL.  No changes are made today.  I will see her back in 1 year. Essential hypertension Blood pressure has been well-controlled.  She has been under a lot of stress the last 2 days and attributes her mild blood pressure elevation to this.  She will continue on amlodipine  and metoprolol  at current doses. Mixed hyperlipidemia LDL 41, HDL 65, Trig 63, ALT 38. Continue rosuvastatin . SVT (supraventricular tachycardia) (HCC) EP evaluation reviewed. Recommendations for continued observation rather than proceeding with ablation. Continue metoprolol . Felt to have transient atrial tachycardia.  Palpitations No symptoms at present. Continue metoprolol .  Murmur, cardiac The  patient has mild aortic sclerosis.  She understands that we will plan to repeat an echo at 3 to 5 years unless there is some change in her exam.  I reviewed her echocardiogram from last year today.     Medication Adjustments/Labs and Tests Ordered: Current medicines are reviewed at length with the patient today.  Concerns regarding medicines are outlined above.  Orders Placed This Encounter  Procedures   EKG 12-Lead   Meds ordered this encounter  Medications   rosuvastatin  (CRESTOR ) 10 MG tablet    Sig: Take 1 tablet (10 mg total) by mouth daily.    Dispense:  90 tablet    Refill:  3    Patient Instructions  Follow-Up: At [facility], you and your health needs are our priority.  As part of our continuing mission to provide you with exceptional heart care, our providers are all part of one team.  This team includes your primary Cardiologist (physician) and Advanced Practice Providers or APPs (Physician Assistants and Nurse Practitioners) who all work  together to provide you with the care you need, when you need it.  Your next appointment:   1 year(s)  Provider:   Arnoldo Lapping, MD       Signed, Arnoldo Lapping, MD  11/08/2023 10:14 AM    [facility]

## 2023-11-10 ENCOUNTER — Telehealth: Payer: Self-pay

## 2023-11-10 NOTE — Progress Notes (Signed)
   11/10/2023  Patient ID: Felicia Silva, female   DOB: [birth year], 68 y.o.   MRN: [id number]   Patient appeared on insurance report for not passing the quality metrics in 2024:  Medication Adherence for Cholesterol (MAC)   Outreach to the patient was not needed today.  Meds Tracking:  -Rosuvastatin  10 mg - Last fill 90DS on 11/08/23, LDL 41 on 10/01/23. Does not qualify for metric yet this year, next fill due 02/06/24.  -Metformin ER 500 mg - Last fill 90DS on 10/08/23, A1C 7.8 on 10/01/23. Does not qualify for metric yet this year, next fill due 01/06/24.  Plan:  Up to date on meds, all documentation going forward will be in [facility]. Next fill history review on 01/10/24.   Flint Hummer, PharmD

## 2023-12-03 ENCOUNTER — Other Ambulatory Visit: Payer: Self-pay | Admitting: Cardiovascular Disease

## 2023-12-03 DIAGNOSIS — R002 Palpitations: Secondary | ICD-10-CM

## 2023-12-03 DIAGNOSIS — I2510 Atherosclerotic heart disease of native coronary artery without angina pectoris: Secondary | ICD-10-CM

## 2023-12-03 DIAGNOSIS — I10 Essential (primary) hypertension: Secondary | ICD-10-CM

## 2023-12-03 DIAGNOSIS — E782 Mixed hyperlipidemia: Secondary | ICD-10-CM

## 2023-12-03 DIAGNOSIS — R011 Cardiac murmur, unspecified: Secondary | ICD-10-CM

## 2024-01-10 ENCOUNTER — Telehealth: Payer: Self-pay

## 2024-01-10 NOTE — Telephone Encounter (Signed)
 Up to date on meds, next review in August

## 2024-01-20 DIAGNOSIS — I10 Essential (primary) hypertension: Secondary | ICD-10-CM | POA: Diagnosis not present

## 2024-02-10 ENCOUNTER — Telehealth: Payer: Self-pay

## 2024-02-10 NOTE — Telephone Encounter (Signed)
 Up to date on meds, next review in August

## 2024-03-01 DIAGNOSIS — R011 Cardiac murmur, unspecified: Secondary | ICD-10-CM | POA: Diagnosis not present

## 2024-03-01 DIAGNOSIS — R945 Abnormal results of liver function studies: Secondary | ICD-10-CM | POA: Diagnosis not present

## 2024-03-01 DIAGNOSIS — E782 Mixed hyperlipidemia: Secondary | ICD-10-CM | POA: Diagnosis not present

## 2024-03-01 DIAGNOSIS — E1169 Type 2 diabetes mellitus with other specified complication: Secondary | ICD-10-CM | POA: Diagnosis not present

## 2024-03-01 DIAGNOSIS — I471 Supraventricular tachycardia, unspecified: Secondary | ICD-10-CM | POA: Diagnosis not present

## 2024-03-01 DIAGNOSIS — M810 Age-related osteoporosis without current pathological fracture: Secondary | ICD-10-CM | POA: Diagnosis not present

## 2024-03-01 DIAGNOSIS — N183 Chronic kidney disease, stage 3 unspecified: Secondary | ICD-10-CM | POA: Diagnosis not present

## 2024-03-01 DIAGNOSIS — I129 Hypertensive chronic kidney disease with stage 1 through stage 4 chronic kidney disease, or unspecified chronic kidney disease: Secondary | ICD-10-CM | POA: Diagnosis not present

## 2024-03-01 DIAGNOSIS — I77810 Thoracic aortic ectasia: Secondary | ICD-10-CM | POA: Diagnosis not present

## 2024-03-01 DIAGNOSIS — I252 Old myocardial infarction: Secondary | ICD-10-CM | POA: Diagnosis not present

## 2024-03-01 DIAGNOSIS — I2510 Atherosclerotic heart disease of native coronary artery without angina pectoris: Secondary | ICD-10-CM | POA: Diagnosis not present

## 2024-03-01 DIAGNOSIS — I10 Essential (primary) hypertension: Secondary | ICD-10-CM | POA: Diagnosis not present

## 2024-04-14 ENCOUNTER — Other Ambulatory Visit (HOSPITAL_COMMUNITY): Payer: Self-pay | Admitting: Internal Medicine

## 2024-04-14 DIAGNOSIS — M810 Age-related osteoporosis without current pathological fracture: Secondary | ICD-10-CM

## 2024-04-17 ENCOUNTER — Encounter: Payer: Self-pay | Admitting: *Deleted

## 2024-04-17 NOTE — Progress Notes (Signed)
 Felicia Silva                                          MRN: [id number]   04/17/2024   The [facility] Quality Team Specialist reviewed this patient medical record for the purposes of chart review for care gap closure. The following were reviewed: chart review for care gap closure-kidney health evaluation for diabetes:eGFR  and uACR.    [facility]BCI Quality Team
